# Patient Record
Sex: Male | Born: 1945 | Race: White | Hispanic: Yes | Marital: Married | Smoking: Former smoker
Health system: Southern US, Community
[De-identification: ages and names within clinical notes are randomized; demographics above are authoritative.]

## PROBLEM LIST (undated history)

## (undated) DIAGNOSIS — E785 Hyperlipidemia, unspecified: Secondary | ICD-10-CM

## (undated) HISTORY — DX: Hyperlipidemia, unspecified: E78.5

---
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
## 1974-07-16 HISTORY — PX: EYE SURGERY: SHX253

## 2006-12-01 ENCOUNTER — Emergency Department: Payer: Self-pay | Admitting: Emergency Medicine

## 2009-01-18 ENCOUNTER — Ambulatory Visit: Payer: Self-pay | Admitting: Family Medicine

## 2009-03-22 ENCOUNTER — Ambulatory Visit: Payer: Self-pay | Admitting: Family Medicine

## 2010-06-02 IMAGING — CR DG LUMBAR SPINE 2-3V
1 series · 3 of 3 positions shown · non-contrast
Comparison: none

REASON FOR EXAM: back pain
COMMENTS:

[Series 2: view not recorded · 0.17mm/px · 3 of 3 slices shown]
[im 1/3]
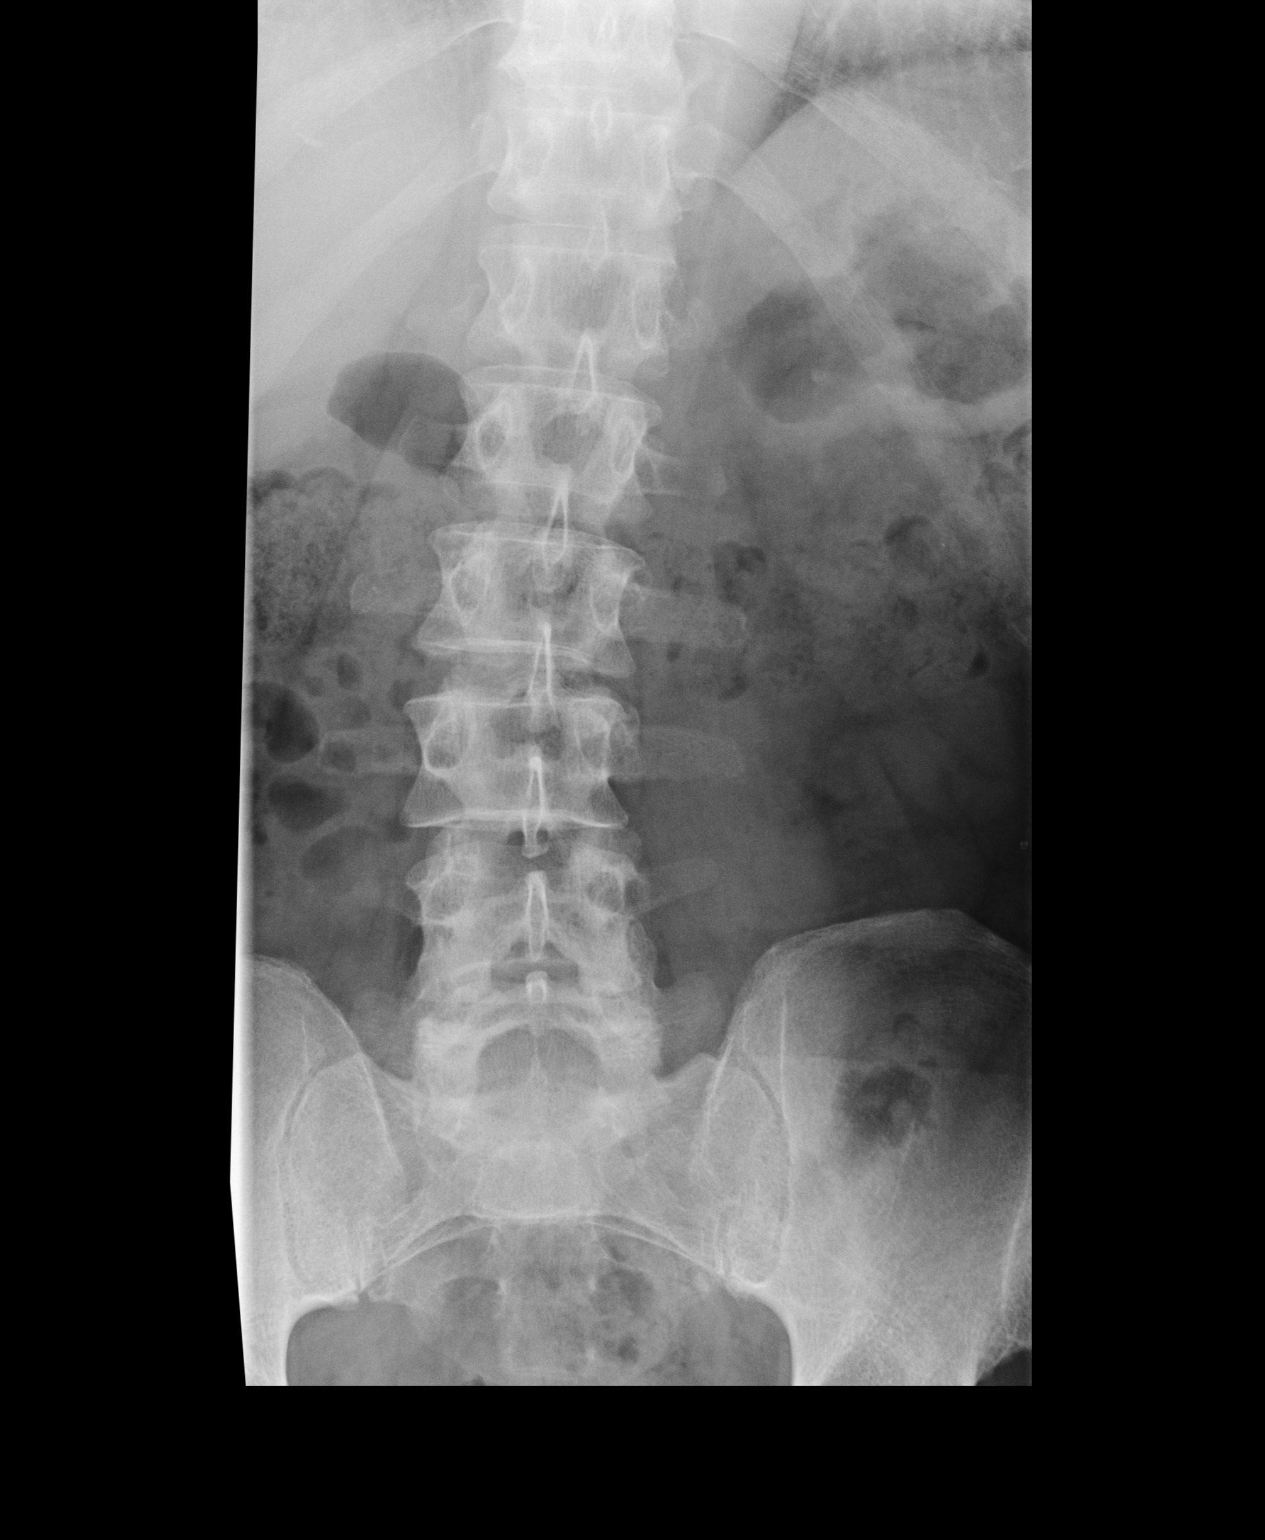
[im 2/3]
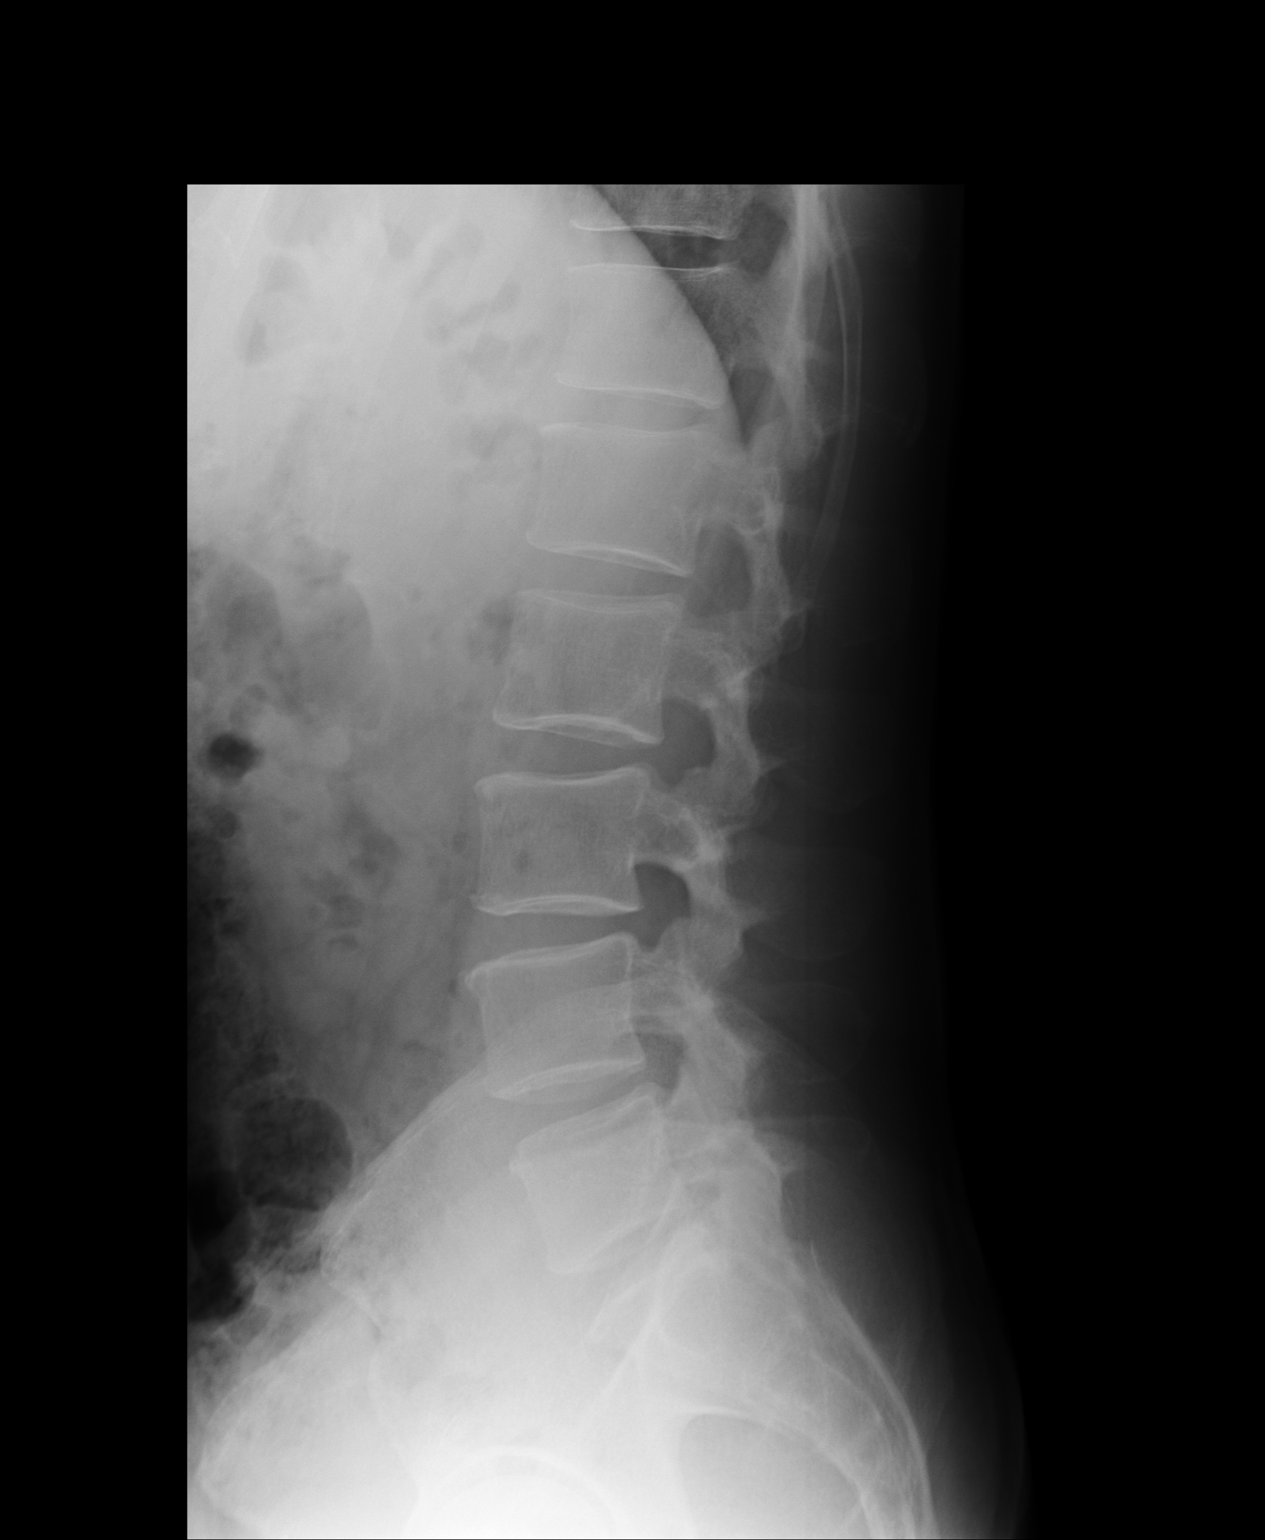
[im 3/3]
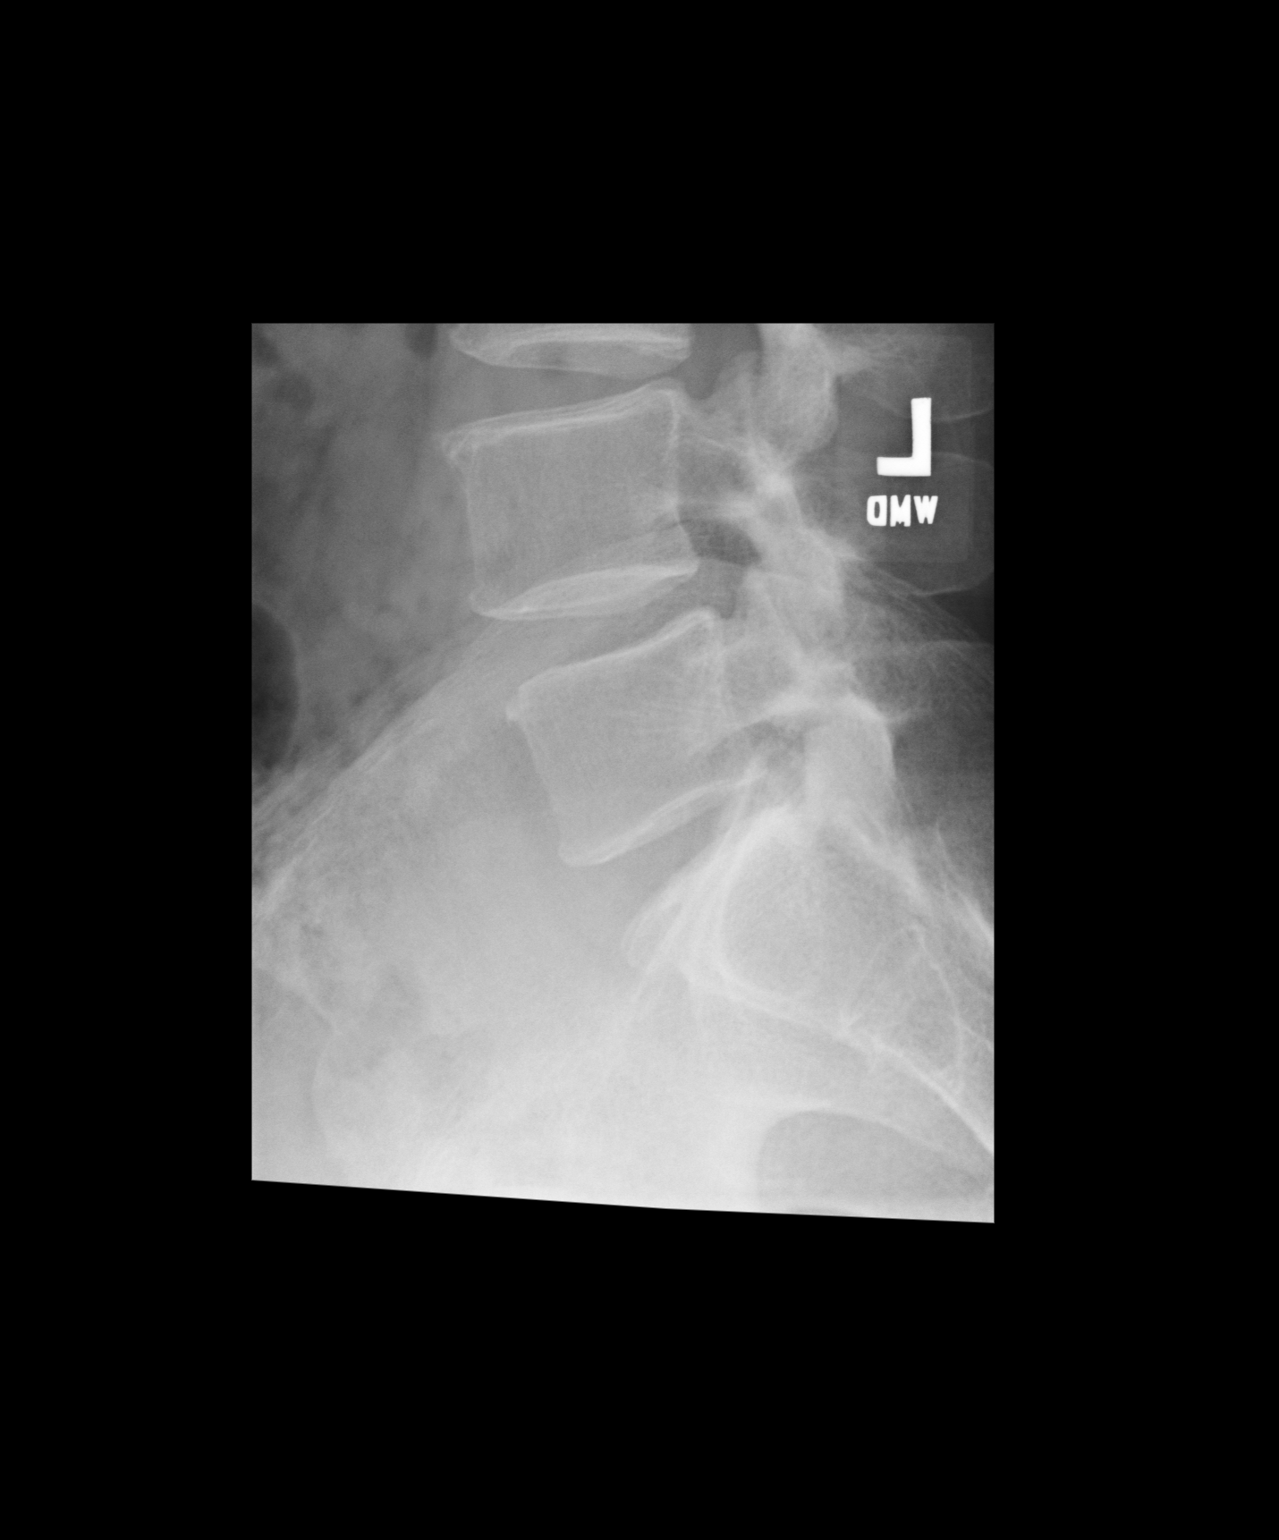

[3 of 3 positions shown; findings below may reference images not displayed]

PROCEDURE:     KDR - KDXR LUMBAR SPINE AP AND LATERAL  - January 18, 2009 [DATE]

RESULT:     The vertebral body heights and the intervertebral disc spaces
are well maintained. The vertebral body alignment is normal. The pedicles
and transverse processes are normal in appearance. In the AP view there is
noted a slight lumbar scoliosis with the convexity to the right. Incidental
note is made that six lumbar vertebral bodies are visualized.
IMPRESSION: 1. No fracture is seen.
2. There is a slight lumbar scoliosis with the convexity to the right.
3. Six lumbar vertebral bodies are visualized.

## 2011-10-01 DIAGNOSIS — R972 Elevated prostate specific antigen [PSA]: Secondary | ICD-10-CM | POA: Diagnosis not present

## 2011-10-01 DIAGNOSIS — E559 Vitamin D deficiency, unspecified: Secondary | ICD-10-CM | POA: Diagnosis not present

## 2011-10-01 DIAGNOSIS — R030 Elevated blood-pressure reading, without diagnosis of hypertension: Secondary | ICD-10-CM | POA: Diagnosis not present

## 2011-10-01 DIAGNOSIS — Z0000 Encounter for general adult medical examination without abnormal findings: Secondary | ICD-10-CM | POA: Diagnosis not present

## 2011-10-05 DIAGNOSIS — Z23 Encounter for immunization: Secondary | ICD-10-CM | POA: Diagnosis not present

## 2011-10-05 DIAGNOSIS — Z0000 Encounter for general adult medical examination without abnormal findings: Secondary | ICD-10-CM | POA: Diagnosis not present

## 2011-11-16 DIAGNOSIS — L570 Actinic keratosis: Secondary | ICD-10-CM | POA: Diagnosis not present

## 2011-11-16 DIAGNOSIS — L821 Other seborrheic keratosis: Secondary | ICD-10-CM | POA: Diagnosis not present

## 2012-01-11 DIAGNOSIS — S8010XA Contusion of unspecified lower leg, initial encounter: Secondary | ICD-10-CM | POA: Diagnosis not present

## 2012-01-11 DIAGNOSIS — Z23 Encounter for immunization: Secondary | ICD-10-CM | POA: Diagnosis not present

## 2012-02-18 DIAGNOSIS — H179 Unspecified corneal scar and opacity: Secondary | ICD-10-CM | POA: Diagnosis not present

## 2012-02-18 DIAGNOSIS — S058X9A Other injuries of unspecified eye and orbit, initial encounter: Secondary | ICD-10-CM | POA: Diagnosis not present

## 2012-02-28 DIAGNOSIS — H1030 Unspecified acute conjunctivitis, unspecified eye: Secondary | ICD-10-CM | POA: Diagnosis not present

## 2012-10-17 DIAGNOSIS — Z1212 Encounter for screening for malignant neoplasm of rectum: Secondary | ICD-10-CM | POA: Diagnosis not present

## 2012-10-17 DIAGNOSIS — Z23 Encounter for immunization: Secondary | ICD-10-CM | POA: Diagnosis not present

## 2012-10-17 DIAGNOSIS — Z1331 Encounter for screening for depression: Secondary | ICD-10-CM | POA: Diagnosis not present

## 2012-10-17 DIAGNOSIS — Z0000 Encounter for general adult medical examination without abnormal findings: Secondary | ICD-10-CM | POA: Diagnosis not present

## 2012-10-17 DIAGNOSIS — Z1339 Encounter for screening examination for other mental health and behavioral disorders: Secondary | ICD-10-CM | POA: Diagnosis not present

## 2012-10-20 DIAGNOSIS — R030 Elevated blood-pressure reading, without diagnosis of hypertension: Secondary | ICD-10-CM | POA: Diagnosis not present

## 2012-10-20 DIAGNOSIS — E559 Vitamin D deficiency, unspecified: Secondary | ICD-10-CM | POA: Diagnosis not present

## 2012-10-20 DIAGNOSIS — Z125 Encounter for screening for malignant neoplasm of prostate: Secondary | ICD-10-CM | POA: Diagnosis not present

## 2012-10-24 DIAGNOSIS — T148 Other injury of unspecified body region: Secondary | ICD-10-CM | POA: Diagnosis not present

## 2012-11-25 DIAGNOSIS — N342 Other urethritis: Secondary | ICD-10-CM | POA: Diagnosis not present

## 2012-11-25 DIAGNOSIS — R300 Dysuria: Secondary | ICD-10-CM | POA: Diagnosis not present

## 2012-11-25 DIAGNOSIS — Z23 Encounter for immunization: Secondary | ICD-10-CM | POA: Diagnosis not present

## 2012-11-25 DIAGNOSIS — Z1212 Encounter for screening for malignant neoplasm of rectum: Secondary | ICD-10-CM | POA: Diagnosis not present

## 2013-02-16 DIAGNOSIS — G4700 Insomnia, unspecified: Secondary | ICD-10-CM | POA: Diagnosis not present

## 2013-02-16 DIAGNOSIS — R1110 Vomiting, unspecified: Secondary | ICD-10-CM | POA: Diagnosis not present

## 2013-02-16 DIAGNOSIS — R972 Elevated prostate specific antigen [PSA]: Secondary | ICD-10-CM | POA: Diagnosis not present

## 2013-02-16 LAB — CBC AND DIFFERENTIAL
HCT: 47 % (ref 41–53)
Hemoglobin: 16.2 g/dL (ref 13.5–17.5)
Platelets: 317 10*3/uL (ref 150–399)
WBC: 7.9 10*3/mL

## 2013-02-16 LAB — BASIC METABOLIC PANEL
BUN: 13 mg/dL (ref 4–21)
Creatinine: 0.9 mg/dL (ref <=1.3)
Potassium: 4.9 mmol/L (ref 3.4–5.3)
Sodium: 139 mmol/L (ref 137–147)

## 2013-02-16 LAB — HEPATIC FUNCTION PANEL
ALK PHOS: 75 U/L (ref 25–125)
ALT: 38 U/L (ref 10–40)
AST: 25 U/L (ref 14–40)
BILIRUBIN, TOTAL: 0.7 mg/dL

## 2013-02-16 LAB — TSH: TSH: 3.09 u[IU]/mL (ref <=5.90)

## 2013-03-20 DIAGNOSIS — R972 Elevated prostate specific antigen [PSA]: Secondary | ICD-10-CM | POA: Diagnosis not present

## 2013-04-16 DIAGNOSIS — R110 Nausea: Secondary | ICD-10-CM | POA: Diagnosis not present

## 2013-04-20 DIAGNOSIS — R0902 Hypoxemia: Secondary | ICD-10-CM | POA: Diagnosis not present

## 2013-07-22 DIAGNOSIS — R198 Other specified symptoms and signs involving the digestive system and abdomen: Secondary | ICD-10-CM | POA: Diagnosis not present

## 2013-07-22 DIAGNOSIS — R972 Elevated prostate specific antigen [PSA]: Secondary | ICD-10-CM | POA: Diagnosis not present

## 2013-07-22 DIAGNOSIS — Z86010 Personal history of colonic polyps: Secondary | ICD-10-CM | POA: Diagnosis not present

## 2013-08-06 DIAGNOSIS — D126 Benign neoplasm of colon, unspecified: Secondary | ICD-10-CM | POA: Diagnosis not present

## 2013-08-06 DIAGNOSIS — K921 Melena: Secondary | ICD-10-CM | POA: Diagnosis not present

## 2013-08-06 DIAGNOSIS — K589 Irritable bowel syndrome without diarrhea: Secondary | ICD-10-CM | POA: Diagnosis not present

## 2013-09-11 DIAGNOSIS — R972 Elevated prostate specific antigen [PSA]: Secondary | ICD-10-CM | POA: Diagnosis not present

## 2013-09-11 DIAGNOSIS — Z86010 Personal history of colonic polyps: Secondary | ICD-10-CM | POA: Diagnosis not present

## 2013-09-11 DIAGNOSIS — R002 Palpitations: Secondary | ICD-10-CM | POA: Diagnosis not present

## 2013-10-14 DIAGNOSIS — K5730 Diverticulosis of large intestine without perforation or abscess without bleeding: Secondary | ICD-10-CM | POA: Diagnosis not present

## 2013-10-14 DIAGNOSIS — K6389 Other specified diseases of intestine: Secondary | ICD-10-CM | POA: Diagnosis not present

## 2013-10-14 DIAGNOSIS — K648 Other hemorrhoids: Secondary | ICD-10-CM | POA: Diagnosis not present

## 2013-10-14 DIAGNOSIS — Z86010 Personal history of colonic polyps: Secondary | ICD-10-CM | POA: Diagnosis not present

## 2013-10-14 DIAGNOSIS — Z1211 Encounter for screening for malignant neoplasm of colon: Secondary | ICD-10-CM | POA: Diagnosis not present

## 2013-10-14 DIAGNOSIS — K5732 Diverticulitis of large intestine without perforation or abscess without bleeding: Secondary | ICD-10-CM | POA: Diagnosis not present

## 2013-10-14 LAB — HM COLONOSCOPY

## 2013-12-11 DIAGNOSIS — D235 Other benign neoplasm of skin of trunk: Secondary | ICD-10-CM | POA: Diagnosis not present

## 2013-12-11 DIAGNOSIS — L821 Other seborrheic keratosis: Secondary | ICD-10-CM | POA: Diagnosis not present

## 2014-03-09 DIAGNOSIS — Z0000 Encounter for general adult medical examination without abnormal findings: Secondary | ICD-10-CM | POA: Diagnosis not present

## 2014-03-09 DIAGNOSIS — Z1331 Encounter for screening for depression: Secondary | ICD-10-CM | POA: Diagnosis not present

## 2014-03-09 DIAGNOSIS — Z1339 Encounter for screening examination for other mental health and behavioral disorders: Secondary | ICD-10-CM | POA: Diagnosis not present

## 2014-03-09 DIAGNOSIS — Z86010 Personal history of colonic polyps: Secondary | ICD-10-CM | POA: Diagnosis not present

## 2014-03-09 DIAGNOSIS — R972 Elevated prostate specific antigen [PSA]: Secondary | ICD-10-CM | POA: Diagnosis not present

## 2014-03-10 DIAGNOSIS — E559 Vitamin D deficiency, unspecified: Secondary | ICD-10-CM | POA: Diagnosis not present

## 2014-03-10 DIAGNOSIS — Z125 Encounter for screening for malignant neoplasm of prostate: Secondary | ICD-10-CM | POA: Diagnosis not present

## 2014-03-10 DIAGNOSIS — E785 Hyperlipidemia, unspecified: Secondary | ICD-10-CM | POA: Diagnosis not present

## 2014-03-10 DIAGNOSIS — Z131 Encounter for screening for diabetes mellitus: Secondary | ICD-10-CM | POA: Diagnosis not present

## 2014-03-10 LAB — BASIC METABOLIC PANEL: Glucose: 107 mg/dL

## 2014-03-10 LAB — LIPID PANEL
CHOLESTEROL: 190 mg/dL (ref 0–200)
HDL: 42 mg/dL (ref 35–70)
LDL Cholesterol: 132 mg/dL
Triglycerides: 80 mg/dL (ref 40–160)

## 2014-03-10 LAB — PSA: PSA: 6

## 2014-03-26 DIAGNOSIS — R972 Elevated prostate specific antigen [PSA]: Secondary | ICD-10-CM | POA: Diagnosis not present

## 2014-04-02 ENCOUNTER — Ambulatory Visit: Payer: Self-pay | Admitting: Family Medicine

## 2014-04-02 DIAGNOSIS — G478 Other sleep disorders: Secondary | ICD-10-CM | POA: Diagnosis not present

## 2014-04-02 DIAGNOSIS — G4700 Insomnia, unspecified: Secondary | ICD-10-CM | POA: Diagnosis not present

## 2014-04-02 DIAGNOSIS — R0609 Other forms of dyspnea: Secondary | ICD-10-CM | POA: Diagnosis not present

## 2014-04-02 DIAGNOSIS — G4733 Obstructive sleep apnea (adult) (pediatric): Secondary | ICD-10-CM | POA: Diagnosis not present

## 2014-04-02 DIAGNOSIS — R0989 Other specified symptoms and signs involving the circulatory and respiratory systems: Secondary | ICD-10-CM | POA: Diagnosis not present

## 2014-04-27 ENCOUNTER — Ambulatory Visit: Payer: Self-pay | Admitting: Family Medicine

## 2014-04-27 DIAGNOSIS — R0683 Snoring: Secondary | ICD-10-CM | POA: Diagnosis not present

## 2014-04-27 DIAGNOSIS — G4733 Obstructive sleep apnea (adult) (pediatric): Secondary | ICD-10-CM | POA: Diagnosis not present

## 2014-07-21 DIAGNOSIS — G4733 Obstructive sleep apnea (adult) (pediatric): Secondary | ICD-10-CM | POA: Diagnosis not present

## 2014-12-16 ENCOUNTER — Encounter: Payer: Self-pay | Admitting: Physician Assistant

## 2014-12-16 ENCOUNTER — Ambulatory Visit (INDEPENDENT_AMBULATORY_CARE_PROVIDER_SITE_OTHER): Payer: Medicare Other | Admitting: Physician Assistant

## 2014-12-16 VITALS — BP 130/78 | HR 74 | Temp 98.7°F | Resp 16 | Wt 195.6 lb

## 2014-12-16 DIAGNOSIS — Z86010 Personal history of colonic polyps: Secondary | ICD-10-CM | POA: Insufficient documentation

## 2014-12-16 DIAGNOSIS — K5790 Diverticulosis of intestine, part unspecified, without perforation or abscess without bleeding: Secondary | ICD-10-CM | POA: Insufficient documentation

## 2014-12-16 DIAGNOSIS — E785 Hyperlipidemia, unspecified: Secondary | ICD-10-CM | POA: Insufficient documentation

## 2014-12-16 DIAGNOSIS — B029 Zoster without complications: Secondary | ICD-10-CM | POA: Insufficient documentation

## 2014-12-16 DIAGNOSIS — D1803 Hemangioma of intra-abdominal structures: Secondary | ICD-10-CM | POA: Insufficient documentation

## 2014-12-16 DIAGNOSIS — M62830 Muscle spasm of back: Secondary | ICD-10-CM

## 2014-12-16 DIAGNOSIS — K589 Irritable bowel syndrome without diarrhea: Secondary | ICD-10-CM | POA: Insufficient documentation

## 2014-12-16 DIAGNOSIS — I10 Essential (primary) hypertension: Secondary | ICD-10-CM | POA: Insufficient documentation

## 2014-12-16 DIAGNOSIS — E559 Vitamin D deficiency, unspecified: Secondary | ICD-10-CM | POA: Insufficient documentation

## 2014-12-16 DIAGNOSIS — R972 Elevated prostate specific antigen [PSA]: Secondary | ICD-10-CM | POA: Insufficient documentation

## 2014-12-16 DIAGNOSIS — N400 Enlarged prostate without lower urinary tract symptoms: Secondary | ICD-10-CM | POA: Insufficient documentation

## 2014-12-16 DIAGNOSIS — W1809XA Striking against other object with subsequent fall, initial encounter: Secondary | ICD-10-CM

## 2014-12-16 DIAGNOSIS — B9681 Helicobacter pylori [H. pylori] as the cause of diseases classified elsewhere: Secondary | ICD-10-CM | POA: Insufficient documentation

## 2014-12-16 DIAGNOSIS — F419 Anxiety disorder, unspecified: Secondary | ICD-10-CM | POA: Insufficient documentation

## 2014-12-16 DIAGNOSIS — R030 Elevated blood-pressure reading, without diagnosis of hypertension: Secondary | ICD-10-CM

## 2014-12-16 DIAGNOSIS — K289 Gastrojejunal ulcer, unspecified as acute or chronic, without hemorrhage or perforation: Secondary | ICD-10-CM

## 2014-12-16 DIAGNOSIS — K219 Gastro-esophageal reflux disease without esophagitis: Secondary | ICD-10-CM | POA: Insufficient documentation

## 2014-12-16 DIAGNOSIS — G43909 Migraine, unspecified, not intractable, without status migrainosus: Secondary | ICD-10-CM | POA: Insufficient documentation

## 2014-12-16 DIAGNOSIS — Z9989 Dependence on other enabling machines and devices: Secondary | ICD-10-CM | POA: Insufficient documentation

## 2014-12-16 DIAGNOSIS — R002 Palpitations: Secondary | ICD-10-CM | POA: Insufficient documentation

## 2014-12-16 DIAGNOSIS — G4733 Obstructive sleep apnea (adult) (pediatric): Secondary | ICD-10-CM | POA: Insufficient documentation

## 2014-12-16 DIAGNOSIS — R198 Other specified symptoms and signs involving the digestive system and abdomen: Secondary | ICD-10-CM | POA: Insufficient documentation

## 2014-12-16 NOTE — Progress Notes (Signed)
   Subjective:    Patient ID: Lawrence Ewing, male    DOB: 08/04/1945, 69 y.o.   MRN: [id number]  Fall The accident occurred 12 to 24 hours ago. The fall occurred while walking. He fell from a height of 1 to 2 ft. He landed on concrete. There was no blood loss. The point of impact was the buttocks and left hip. The pain is present in the left hip and buttocks. The pain is at a severity of 6/10. The pain is moderate. The symptoms are aggravated by movement. Pertinent negatives include no abdominal pain, bowel incontinence, fever, headaches, loss of consciousness, nausea, numbness, tingling or vomiting. He has tried ice and rest for the symptoms. The treatment provided moderate relief.      Review of Systems  Constitutional: Negative for fever, chills and fatigue.  Gastrointestinal: Negative for nausea, vomiting, abdominal pain and bowel incontinence.  Musculoskeletal: Positive for myalgias and back pain. Negative for joint swelling, arthralgias, gait problem, neck pain and neck stiffness.  Neurological: Negative for dizziness, tingling, loss of consciousness, syncope, weakness, numbness and headaches.       Objective:   Physical Exam  Constitutional: He is oriented to person, place, and time. He appears well-developed and well-nourished. No distress.  Musculoskeletal: Normal range of motion. He exhibits no edema.       Lumbar back: He exhibits tenderness, swelling (mild swelling over quadratus lumborum on left and left glute) and spasm (noted in paraspinal muscles and quadratus lumborum on left). He exhibits normal range of motion, no bony tenderness, no edema, no deformity, no laceration, no pain and normal pulse.       Arms: Neurological: He is alert and oriented to person, place, and time. No cranial nerve deficit.  Skin: Skin is warm and dry.  Vitals reviewed.         Assessment & Plan:  1. Fall against object, initial encounter Fell on wet steps in flip flops and hit back  along steps.  No suspected fractures.  No bony tenderness.  Spasm noted on left side.  Patient refuses muscle relaxer or anti-inflammatory.  Advised to continue ice and heat.  May apply icy-hot patch for relief.  Advised to stretch with patch to increase ROM and decrease spasm.  He is to call the office if no improvement in one week.  If no improvement will obtain xray.  2. Muscle spasm of back  Spasm noted on left side.  Patient refuses muscle relaxer or anti-inflammatory.  Advised to continue ice and heat.  May apply icy-hot patch for relief.  Advised to stretch with patch to increase ROM and decrease spasm.  He is to call the office if no improvement in one week.  If no improvement will obtain xray.

## 2014-12-16 NOTE — Patient Instructions (Signed)

## 2015-03-14 DIAGNOSIS — H11003 Unspecified pterygium of eye, bilateral: Secondary | ICD-10-CM | POA: Diagnosis not present

## 2015-03-16 ENCOUNTER — Encounter: Payer: Self-pay | Admitting: Family Medicine

## 2015-03-16 ENCOUNTER — Other Ambulatory Visit: Payer: Self-pay

## 2015-03-16 DIAGNOSIS — Z131 Encounter for screening for diabetes mellitus: Secondary | ICD-10-CM | POA: Diagnosis not present

## 2015-03-16 DIAGNOSIS — Z125 Encounter for screening for malignant neoplasm of prostate: Secondary | ICD-10-CM | POA: Diagnosis not present

## 2015-03-16 DIAGNOSIS — E559 Vitamin D deficiency, unspecified: Secondary | ICD-10-CM | POA: Diagnosis not present

## 2015-03-16 DIAGNOSIS — E785 Hyperlipidemia, unspecified: Secondary | ICD-10-CM | POA: Diagnosis not present

## 2015-03-17 ENCOUNTER — Encounter: Payer: Self-pay | Admitting: Family Medicine

## 2015-03-17 ENCOUNTER — Telehealth: Payer: Self-pay | Admitting: Family Medicine

## 2015-03-17 LAB — LIPID PANEL
Chol/HDL Ratio: 4.6 ratio units (ref 0.0–5.0)
Cholesterol, Total: 180 mg/dL (ref 100–199)
HDL: 39 mg/dL — ABNORMAL LOW (ref >39)
LDL CALC: 117 mg/dL — AB (ref 0–99)
TRIGLYCERIDES: 120 mg/dL (ref 0–149)
VLDL Cholesterol Cal: 24 mg/dL (ref 5–40)

## 2015-03-17 LAB — VITAMIN D 25 HYDROXY (VIT D DEFICIENCY, FRACTURES): Vit D, 25-Hydroxy: 22.9 ng/mL — ABNORMAL LOW (ref 30.0–100.0)

## 2015-03-17 LAB — GLUCOSE, RANDOM: Glucose: 107 mg/dL — ABNORMAL HIGH (ref 65–99)

## 2015-03-17 LAB — PSA: Prostate Specific Ag, Serum: 8 ng/mL — ABNORMAL HIGH (ref 0.0–4.0)

## 2015-03-17 NOTE — Telephone Encounter (Signed)
Pt is returning call.  OF#[phone]/[id number]

## 2015-03-18 NOTE — Telephone Encounter (Signed)
Patient was notified of results. Patient stated that he will call to schedule appt with his urologist.

## 2015-03-25 ENCOUNTER — Encounter: Payer: Self-pay | Admitting: Family Medicine

## 2015-03-25 ENCOUNTER — Ambulatory Visit (INDEPENDENT_AMBULATORY_CARE_PROVIDER_SITE_OTHER): Payer: Medicare Other | Admitting: Family Medicine

## 2015-03-25 VITALS — BP 128/80 | HR 78 | Temp 98.2°F | Resp 16 | Ht 71.0 in | Wt 191.0 lb

## 2015-03-25 DIAGNOSIS — Z23 Encounter for immunization: Secondary | ICD-10-CM | POA: Diagnosis not present

## 2015-03-25 DIAGNOSIS — Z0000 Encounter for general adult medical examination without abnormal findings: Secondary | ICD-10-CM

## 2015-03-25 DIAGNOSIS — G4733 Obstructive sleep apnea (adult) (pediatric): Secondary | ICD-10-CM

## 2015-03-25 DIAGNOSIS — R300 Dysuria: Secondary | ICD-10-CM

## 2015-03-25 DIAGNOSIS — E785 Hyperlipidemia, unspecified: Secondary | ICD-10-CM | POA: Diagnosis not present

## 2015-03-25 DIAGNOSIS — E559 Vitamin D deficiency, unspecified: Secondary | ICD-10-CM | POA: Diagnosis not present

## 2015-03-25 LAB — POCT URINALYSIS DIPSTICK
Bilirubin, UA: NEGATIVE
Glucose, UA: NEGATIVE
KETONES UA: NEGATIVE
LEUKOCYTES UA: NEGATIVE
NITRITE UA: NEGATIVE
PH UA: 7
PROTEIN UA: NEGATIVE
Spec Grav, UA: 1.01
UROBILINOGEN UA: 0.2

## 2015-03-25 NOTE — Progress Notes (Signed)
Patient: Lawrence Ewing, Male    DOB: March 05, 1946, 69 y.o.   MRN: [id number] Visit Date: 03/25/2015  Today's Provider: Lelon Huh, MD   Chief Complaint  Patient presents with  . Medicare Wellness  . Hyperlipidemia    follow up  . Sleep Apnea    follow up   Subjective:    Annual wellness visit Lawrence Ewing is a 69 y.o. male. He feels well. He reports exercising 3- 5 times a week walking. He reports he is sleeping well.  -----------------------------------------------------------   Lipid/Cholesterol, Follow-up:   Last seen for this1 years ago.  Management changes since that visit include  none. . Last Lipid Panel:    Component Value Date/Time   CHOL 180 03/16/2015 1000   CHOL 190 03/10/2014   TRIG 120 03/16/2015 1000   HDL 39* 03/16/2015 1000   HDL 42 03/10/2014   CHOLHDL 4.6 03/16/2015 1000   LDLCALC 117* 03/16/2015 1000   LDLCALC 132 03/10/2014    Risk factors for vascular disease include hypercholesterolemia  He reports good compliance with treatment. (Diet Controlled) He is not having side effects.  Current symptoms include none and have been stable. Weight trend: increasing steadily Prior visit with dietician: no Current diet: in general, a "healthy" diet   Current exercise: walking  Wt Readings from Last 3 Encounters:  03/25/15 191 lb (86.637 kg)  12/16/14 195 lb 9.6 oz (88.724 kg)  07/21/14 197 lb (89.359 kg)    ------------------------------------------------------------------- Follow up Sleep Apnea: Last office visit was 8 months ago and no changes were made. Patient is still using the CPAP machine and reports good tolerance. He is using it every night and feels better. Is more energetic and sleeping solidly.   Follow up Vitamin D Deficiency: Patient recently has labs done on 03/16/2015 which showed a low vitamin D Level. Patient states he was not taking the Vitamin D regularly but plans to take it daily from now on.  Lab Results    Component Value Date   VD25OH 22.9* 03/16/2015    Follow up Elevated PSA: Patient recent labs showed PSA of 8.0. Patient was advised to follow up with Urology soon. Patient states he has an appointment scheduled to see [facility] on 04/22/2015.   Follow up Anxiety: He reports that citalopram is really helping quite a bit and is tolerating well    Review of Systems  Constitutional: Negative for fever, chills, appetite change and fatigue.  HENT: Negative for congestion, ear pain, hearing loss, nosebleeds and trouble swallowing.   Eyes: Negative for pain and visual disturbance.  Respiratory: Negative for cough, chest tightness and shortness of breath.   Cardiovascular: Negative for chest pain, palpitations and leg swelling.  Gastrointestinal: Negative for nausea, vomiting, abdominal pain, diarrhea, constipation and blood in stool.  Endocrine: Negative for polydipsia, polyphagia and polyuria.  Genitourinary: Negative for dysuria and flank pain.  Musculoskeletal: Negative for myalgias, back pain, joint swelling, arthralgias and neck stiffness.  Skin: Negative for color change, rash and wound.  Neurological: Negative for dizziness, tremors, seizures, speech difficulty, weakness, light-headedness and headaches.  Psychiatric/Behavioral: Negative for behavioral problems, confusion, sleep disturbance, dysphoric mood and decreased concentration. The patient is not nervous/anxious.   All other systems reviewed and are negative.   Social History   Social History  . Marital Status: Married    Spouse Name: N/A  . Number of Children: 3  . Years of Education: N/A   Occupational History  . Retired  Previously Freight forwarder in [employer], educational    Social History Main Topics  . Smoking status: Former Research scientist (life sciences)  . Smokeless tobacco: Not on file  . Alcohol Use: 0.0 oz/week    0 Standard drinks or equivalent per week     Comment: occasionally  . Drug Use: No  . Sexual Activity: Not on file   Other  Topics Concern  . Not on file   Social History Narrative    Patient Active Problem List   Diagnosis Date Noted  . Anxiety 12/16/2014  . Altered bowel function 12/16/2014  . 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 Blood pressure elevated 12/16/2014  . Abnormal prostate specific antigen 12/16/2014  . Acid reflux 12/16/2014  . Gastrointestinal ulcer due to Helicobacter pylori 35/32/9924  . H/O adenomatous polyp of colon 12/16/2014  . HLD (hyperlipidemia) 12/16/2014  . 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 Hemangioma of liver 12/16/2014  . Headache, migraine 12/16/2014  . Obstructive sleep apnea of adult 12/16/2014  . Awareness of heartbeats 12/16/2014  . Herpes zona 12/16/2014  . Vitamin D deficiency 12/16/2014    Past Surgical History  Procedure Laterality Date  . Tonsillectomy  1957  . Eye surgery  1976    His family history includes Allergies in his brother; Arthritis in his mother; Hypertension in his mother; Prostate cancer in his father; Stroke in his father.    Previous Medications   CHOLECALCIFEROL (VITAMIN D) 2000 UNITS CAPS    Take 1 capsule by mouth daily.   CITALOPRAM (CELEXA) 10 MG TABLET    Take 10 mg by mouth daily.   CITALOPRAM (CELEXA) 20 MG TABLET    Take 1 tablet by mouth daily.   MULTIPLE VITAMINS-MINERALS PO    Take 1 tablet by mouth daily.   OMEPRAZOLE (PRILOSEC) 20 MG CAPSULE    Take 1 capsule by mouth daily.    Patient Care Team: Birdie Sons, MD as PCP - General (Family Medicine)     Objective:   Vitals: BP 128/80 mmHg  Pulse 78  Temp(Src) 98.2 F (36.8 C) (Oral)  Resp 16  Ht 5\' 11"  (1.803 m)  Wt 191 lb (86.637 kg)  BMI 26.65 kg/m2  SpO2 97%  Physical Exam   General Appearance:    Alert, cooperative, no distress  Eyes:    PERRL, conjunctiva/corneas clear, EOM's intact       Lungs:     Clear to auscultation bilaterally, respirations unlabored  Heart:    Regular rate and rhythm  Neurologic:    Awake, alert, oriented x 3. No apparent focal neurological           defect.       Activities of Daily Living In your present state of health, do you have any difficulty performing the following activities: 03/25/2015  Hearing? N  Vision? N  Difficulty concentrating or making decisions? N  Walking or climbing stairs? N  Dressing or bathing? N  Doing errands, shopping? N    Fall Risk Assessment Fall Risk  03/25/2015  Falls in the past year? Yes  Number falls in past yr: 1  Injury with Fall? Yes     Depression Screen PHQ 2/9 Scores 03/25/2015  PHQ - 2 Score 0    Cognitive Testing - 6-CIT  Correct? Score   What year is it? yes 0 0 or 4  What month is it? yes 0 0 or 3  Memorize:    Pia Mau,  42,  [address],  [place],      What time is it? (within 1 hour) yes 0 0 or 3  Count backwards from 20 yes 0 0, 2, or 4  Name the months of the year yes 0 0, 2, or 4  Repeat name & address above yes 0 0, 2, 4, 6, 8, or 10       TOTAL SCORE  0/28   Interpretation:  Normal  Normal (0-7) Abnormal (8-28)    Audit-C Alcohol Use Screening  Question Answer Points  How often do you have alcoholic drink? 1 times monthly 1  How many drinks do you typically consume in a day? 0 0  How oftey will you drink 6 or more in a total? never 0  Total Score:  1   A score of 3 or more in women, and 4 or more in men indicates increased risk for alcohol abuse, EXCEPT if all of the points are from question 1.    Assessment & Plan:     Annual Wellness Visit  Reviewed patient's Family Medical History Reviewed and updated list of patient's medical providers Assessment of cognitive impairment was done Assessed patient's functional ability Established a written schedule for health screening [place] Completed and Reviewed  Exercise Activities and Dietary recommendations Goals    None      Immunization History  Administered Date(s) Administered  . Pneumococcal  Polysaccharide-23 10/05/2011  . Tdap 02/24/2007    Health Maintenance  Topic Date Due  . COLONOSCOPY  10/15/2023  . ZOSTAVAX  12/10/2005  . PNA vac Low Risk Adult (2 of 2 - PCV13) 10/04/2012  . INFLUENZA VACCINE  02/14/2015  . TETANUS/TDAP  02/23/2017      Discussed health benefits of physical activity, and encouraged him to engage in regular exercise appropriate for his age and condition.    ------------------------------------------------------------------------------------------------------------ 1. Medicare annual wellness visit, subsequent   2. Need for pneumococcal vaccination  - Pneumococcal conjugate vaccine 13-valent IM  3. HLD (hyperlipidemia) Stable, diet controlled  4. Obstructive sleep apnea of adult CPAP remains effective and well tolerating. Is using consistently every night  5. Vitamin D deficiency Back on daily vitamin D supplements.   6. Dysuria  - POCT urinalysis dipstick

## 2015-04-22 DIAGNOSIS — R351 Nocturia: Secondary | ICD-10-CM | POA: Diagnosis not present

## 2015-04-22 DIAGNOSIS — Z8042 Family history of malignant neoplasm of prostate: Secondary | ICD-10-CM | POA: Diagnosis not present

## 2015-04-22 DIAGNOSIS — N401 Enlarged prostate with lower urinary tract symptoms: Secondary | ICD-10-CM | POA: Diagnosis not present

## 2015-04-22 DIAGNOSIS — R972 Elevated prostate specific antigen [PSA]: Secondary | ICD-10-CM | POA: Diagnosis not present

## 2015-04-22 DIAGNOSIS — R350 Frequency of micturition: Secondary | ICD-10-CM | POA: Diagnosis not present

## 2015-04-22 DIAGNOSIS — R300 Dysuria: Secondary | ICD-10-CM | POA: Diagnosis not present

## 2015-05-02 ENCOUNTER — Telehealth: Payer: Self-pay | Admitting: Family Medicine

## 2015-05-02 NOTE — Telephone Encounter (Signed)
Please check with patient to see if he has had follow up with his urologist. His PSA was 8.0 when we checked it in September and he was supposed to get in touch with his urologist. We can set up referral if he has not made this appointment yet. Thanks.

## 2015-05-02 NOTE — Telephone Encounter (Signed)
Patient stated that he went to his urologist Dr. Lonia Skinner,  last week. His PSA was 7.0.

## 2015-05-26 ENCOUNTER — Ambulatory Visit (INDEPENDENT_AMBULATORY_CARE_PROVIDER_SITE_OTHER): Payer: Medicare Other

## 2015-05-26 DIAGNOSIS — Z23 Encounter for immunization: Secondary | ICD-10-CM

## 2015-07-29 DIAGNOSIS — R972 Elevated prostate specific antigen [PSA]: Secondary | ICD-10-CM | POA: Diagnosis not present

## 2015-07-29 DIAGNOSIS — R350 Frequency of micturition: Secondary | ICD-10-CM | POA: Diagnosis not present

## 2015-07-29 DIAGNOSIS — C61 Malignant neoplasm of prostate: Secondary | ICD-10-CM | POA: Diagnosis not present

## 2015-07-29 DIAGNOSIS — Z8042 Family history of malignant neoplasm of prostate: Secondary | ICD-10-CM | POA: Diagnosis not present

## 2015-07-29 DIAGNOSIS — M545 Low back pain: Secondary | ICD-10-CM | POA: Diagnosis not present

## 2015-10-11 ENCOUNTER — Ambulatory Visit: Payer: Medicare Other | Admitting: Family Medicine

## 2015-11-03 DIAGNOSIS — R238 Other skin changes: Secondary | ICD-10-CM | POA: Diagnosis not present

## 2015-11-03 DIAGNOSIS — L538 Other specified erythematous conditions: Secondary | ICD-10-CM | POA: Diagnosis not present

## 2015-11-03 DIAGNOSIS — L820 Inflamed seborrheic keratosis: Secondary | ICD-10-CM | POA: Diagnosis not present

## 2015-11-03 DIAGNOSIS — L821 Other seborrheic keratosis: Secondary | ICD-10-CM | POA: Diagnosis not present

## 2016-03-23 DIAGNOSIS — M545 Low back pain: Secondary | ICD-10-CM | POA: Diagnosis not present

## 2016-03-23 DIAGNOSIS — R399 Unspecified symptoms and signs involving the genitourinary system: Secondary | ICD-10-CM | POA: Diagnosis not present

## 2016-03-23 DIAGNOSIS — R972 Elevated prostate specific antigen [PSA]: Secondary | ICD-10-CM | POA: Diagnosis not present

## 2016-03-27 ENCOUNTER — Encounter: Payer: Self-pay | Admitting: Family Medicine

## 2016-03-27 ENCOUNTER — Ambulatory Visit (INDEPENDENT_AMBULATORY_CARE_PROVIDER_SITE_OTHER): Payer: Medicare Other | Admitting: Family Medicine

## 2016-03-27 VITALS — BP 130/88 | HR 73 | Temp 97.8°F | Resp 16 | Ht 70.5 in | Wt 197.0 lb

## 2016-03-27 DIAGNOSIS — G4733 Obstructive sleep apnea (adult) (pediatric): Secondary | ICD-10-CM

## 2016-03-27 DIAGNOSIS — R5383 Other fatigue: Secondary | ICD-10-CM

## 2016-03-27 DIAGNOSIS — Z23 Encounter for immunization: Secondary | ICD-10-CM | POA: Diagnosis not present

## 2016-03-27 DIAGNOSIS — Z0000 Encounter for general adult medical examination without abnormal findings: Secondary | ICD-10-CM | POA: Diagnosis not present

## 2016-03-27 NOTE — Progress Notes (Signed)
Patient: Lawrence Ewing, Male    DOB: Jun 15, 1946, 70 y.o.   MRN: [id number] Visit Date: 03/27/2016  Today's Provider: Lelon Huh, MD   Chief Complaint  Patient presents with  . Medicare Wellness  . Sleep Apnea  . Hyperlipidemia   Subjective:    Annual wellness visit Lawrence Ewing is a 70 y.o. male. He feels well. He reports exercising 3 times a week. He reports he is sleeping poorly.    Fatigue:  She states he has been somewhat more fatigued lately. States his brother was recently diagnosed with thyroid disorder and concerned he may have same problem.   Follow up Obstructive sleep Apnea:  Patient was last seen for this problem 1 year ago and no changes were made. Patient reports good compliance with CPAP machine, good tolerance and fair symptom control.    Lipid/Cholesterol, Follow-up:   Last seen for this1 years ago.  Management changes since that visit include none; diet controlled. . Last Lipid Panel:    Component Value Date/Time   CHOL 180 03/16/2015 1000   TRIG 120 03/16/2015 1000   HDL 39 (L) 03/16/2015 1000   CHOLHDL 4.6 03/16/2015 1000   LDLCALC 117 (H) 03/16/2015 1000    Risk factors for vascular disease include hypercholesterolemia  He reports good compliance with treatment. He is not having side effects.  Current symptoms include none and have been stable. Weight trend: increasing steadily Prior visit with dietician: no Current diet: in general, a "healthy" diet   Current exercise: walking  Wt Readings from Last 3 Encounters:  03/25/15 191 lb (86.6 kg)  12/16/14 195 lb 9.6 oz (88.7 kg)  07/21/14 197 lb (89.4 kg)    -------------------------------------------------------------------  Follow anxiety  Is taking 20mg  and 10mg  citalopram daily which he feels is working well. Being followed by psychiatry Dr. Carlis Abbott  Review of Systems  Constitutional: Negative for appetite change, chills, fatigue and fever.  HENT: Positive for  tinnitus. Negative for congestion, ear pain, hearing loss, nosebleeds and trouble swallowing.   Eyes: Negative for pain and visual disturbance.  Respiratory: Negative for cough, chest tightness and shortness of breath.   Cardiovascular: Negative for chest pain, palpitations and leg swelling.  Gastrointestinal: Negative for abdominal pain, blood in stool, constipation, diarrhea, nausea and vomiting.  Endocrine: Negative for polydipsia, polyphagia and polyuria.  Genitourinary: Negative for dysuria and flank pain.  Musculoskeletal: Negative for arthralgias, back pain, joint swelling, myalgias and neck stiffness.  Skin: Negative for color change, rash and wound.  Neurological: Negative for dizziness, tremors, seizures, speech difficulty, weakness, light-headedness and headaches.  Psychiatric/Behavioral: Negative for behavioral problems, confusion, decreased concentration, dysphoric mood and sleep disturbance. The patient is not nervous/anxious.   All other systems reviewed and are negative.   Social History   Social History  . Marital status: Married    Spouse name: N/A  . Number of children: 3  . Years of education: N/A   Occupational History  . Retired     Previously Freight forwarder in [employer], educational    Social History Main Topics  . Smoking status: Former Research scientist (life sciences)  . Smokeless tobacco: Never Used  . Alcohol use 0.0 oz/week     Comment: occasionally  . Drug use: No  . Sexual activity: Not on file   Other Topics Concern  . Not on file   Social History Narrative  . No narrative on file    No past medical history on file.   Patient Active Problem List  Diagnosis Date Noted  . Dysuria 03/25/2015  . Anxiety 12/16/2014  . Altered bowel function 12/16/2014  . 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 Blood pressure elevated 12/16/2014  . Abnormal prostate specific antigen 12/16/2014  . Acid reflux 12/16/2014  . Gastrointestinal ulcer due to  Helicobacter pylori 123456  . H/O adenomatous polyp of colon 12/16/2014  . HLD (hyperlipidemia) 12/16/2014  . 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 Hemangioma of liver 12/16/2014  . Headache, migraine 12/16/2014  . Obstructive sleep apnea of adult 12/16/2014  . Awareness of heartbeats 12/16/2014  . Herpes zona 12/16/2014  . Vitamin D deficiency 12/16/2014    Past Surgical History:  Procedure Laterality Date  . EYE SURGERY  1976  . TONSILLECTOMY  1957    His family history includes Allergies in his brother; Arthritis in his mother; Hypertension in his mother; Prostate cancer in his father; Stroke in his father.    Current Meds  Medication Sig  . Cholecalciferol (VITAMIN D) 2000 UNITS CAPS Take 1 capsule by mouth daily.  . citalopram (CELEXA) 10 MG tablet Take 10 mg by mouth daily.  . citalopram (CELEXA) 20 MG tablet Take 1 tablet by mouth daily.  . MULTIPLE VITAMINS-MINERALS PO Take 1 tablet by mouth daily.  Marland Kitchen omeprazole (PRILOSEC) 20 MG capsule Take 1 capsule by mouth daily.    Patient Care Team: Birdie Sons, MD as PCP - General (Family Medicine)    Objective:   Vitals: BP 130/88 (BP Location: Right Arm, Patient Position: Sitting, Cuff Size: Large)   Pulse 73   Temp 97.8 F (36.6 C) (Oral)   Resp 16   Ht 5' 10.5" (1.791 m)   Wt 197 lb (89.4 kg)   SpO2 96% Comment: room air  BMI 27.87 kg/m   Physical Exam   General Appearance:    Alert, cooperative, no distress  Eyes:    PERRL, conjunctiva/corneas clear, EOM's intact       Lungs:     Clear to auscultation bilaterally, respirations unlabored  Heart:    Regular rate and rhythm  Neurologic:   Awake, alert, oriented x 3. No apparent focal neurological           defect.        Activities of Daily Living In your present state of health, do you have any difficulty performing the following activities: 03/27/2016  Hearing? N  Vision? N  Difficulty concentrating or making decisions? N  Walking or climbing stairs? N    Dressing or bathing? N  Doing errands, shopping? N  Some recent data might be hidden    Fall Risk Assessment Fall Risk  03/27/2016 03/25/2015  Falls in the past year? No Yes  Number falls in past yr: - 1  Injury with Fall? - Yes     Depression Screen PHQ 2/9 Scores 03/27/2016 03/25/2015  PHQ - 2 Score 0 0    Cognitive Testing - 6-CIT  Correct? Score   What year is it? yes 0 0 or 4  What month is it? yes 0 0 or 3  Memorize:    Pia [address]au,  42,  [place],      What time is it? (within 1 hour) yes 0 0 or 3  Count backwards from 20 yes 0 0, 2, or 4  Name the months of the year no 2 0, 2, or 4  Repeat name & address above no 4 0, 2, 4, 6, 8,  or 10       TOTAL SCORE  6/28   Interpretation:  Normal  Normal (0-7) Abnormal (8-28)    Audit-C Alcohol Use Screening  Question Answer Points  How often do you have alcoholic drink? 1 times monthly 1  On days you do drink alcohol, how many drinks do you typically consume? n/a 0  How oftey will you drink 6 or more in a total? never 0  Total Score:  1   A score of 3 or more in women, and 4 or more in men indicates increased risk for alcohol abuse, EXCEPT if all of the points are from question 1.   Current Exercise Habits: Home exercise routine, Type of exercise: walking, Time (Minutes): 60, Frequency (Times/Week): 3, Weekly Exercise (Minutes/Week): 180, Intensity: Moderate Exercise limited by: None identified  Assessment & Plan:     Annual Wellness Visit  Reviewed patient's Family Medical History Reviewed and updated list of patient's medical providers Assessment of cognitive impairment was done Assessed patient's functional ability Established a written schedule for health screening [place] Completed and Reviewed  Exercise Activities and Dietary recommendations Goals    None      Immunization History  Administered Date(s) Administered  . Influenza, High Dose Seasonal PF 05/26/2015  .  Pneumococcal Conjugate-13 03/25/2015  . Pneumococcal Polysaccharide-23 10/05/2011  . Tdap 02/24/2007    Health Maintenance  Topic Date Due  . Hepatitis C Screening  1945-09-04  . ZOSTAVAX  12/10/2005  . INFLUENZA VACCINE  02/14/2016  . TETANUS/TDAP  02/23/2017  . COLONOSCOPY  10/15/2023  . PNA vac Low Risk Adult  Completed      Discussed health benefits of physical activity, and encouraged him to engage in regular exercise appropriate for his age and condition.    ------------------------------------------------------------------------------------------------------------  1. Medicare annual wellness visit, subsequent   2. Other fatigue  - Comprehensive metabolic panel - CBC - TSH  3. Obstructive sleep apnea of adult Doing well and consistently using CPAP  4. Need for influenza vaccination  - Flu vaccine HIGH DOSE PF   Lelon Huh, MD  [facility]

## 2016-03-28 ENCOUNTER — Telehealth: Payer: Self-pay

## 2016-03-28 LAB — COMPREHENSIVE METABOLIC PANEL
A/G RATIO: 1.5 (ref 1.2–2.2)
ALK PHOS: 70 IU/L (ref 39–117)
ALT: 36 IU/L (ref 0–44)
AST: 20 IU/L (ref 0–40)
Albumin: 4 g/dL (ref 3.5–4.8)
BUN/Creatinine Ratio: 19 (ref 10–24)
BUN: 16 mg/dL (ref 8–27)
Bilirubin Total: 0.5 mg/dL (ref 0.0–1.2)
CALCIUM: 9.3 mg/dL (ref 8.6–10.2)
CO2: 24 mmol/L (ref 18–29)
CREATININE: 0.86 mg/dL (ref 0.76–1.27)
Chloride: 102 mmol/L (ref 96–106)
GFR calc Af Amer: 102 mL/min/{1.73_m2} (ref >59)
GFR calc non Af Amer: 88 mL/min/{1.73_m2} (ref >59)
GLOBULIN, TOTAL: 2.7 g/dL (ref 1.5–4.5)
Glucose: 100 mg/dL — ABNORMAL HIGH (ref 65–99)
POTASSIUM: 4.8 mmol/L (ref 3.5–5.2)
SODIUM: 141 mmol/L (ref 134–144)
Total Protein: 6.7 g/dL (ref 6.0–8.5)

## 2016-03-28 LAB — CBC
HEMOGLOBIN: 15.4 g/dL (ref 12.6–17.7)
Hematocrit: 45.7 % (ref 37.5–51.0)
MCH: 29.6 pg (ref 26.6–33.0)
MCHC: 33.7 g/dL (ref 31.5–35.7)
MCV: 88 fL (ref 79–97)
PLATELETS: 302 10*3/uL (ref 150–379)
RBC: 5.2 x10E6/uL (ref 4.14–5.80)
RDW: 13.1 % (ref 12.3–15.4)
WBC: 6.9 10*3/uL (ref 3.4–10.8)

## 2016-03-28 LAB — TSH: TSH: 4.47 u[IU]/mL (ref 0.450–4.500)

## 2016-03-28 NOTE — Telephone Encounter (Signed)
Pt advised. Emily Drozdowski, CMA  

## 2016-03-28 NOTE — Telephone Encounter (Signed)
-----   Message from Birdie Sons, MD sent at 03/28/2016  7:50 AM EDT ----- Blood sugar, kidney functions, blood cell count, electrolytes and thyroid are all normal. Check labs yearly.

## 2016-03-28 NOTE — Telephone Encounter (Signed)
LMTCB

## 2016-05-30 ENCOUNTER — Encounter: Payer: Self-pay | Admitting: Family Medicine

## 2016-05-30 ENCOUNTER — Ambulatory Visit (INDEPENDENT_AMBULATORY_CARE_PROVIDER_SITE_OTHER): Payer: Medicare Other | Admitting: Family Medicine

## 2016-05-30 VITALS — BP 132/68 | HR 80 | Temp 98.1°F | Resp 20 | Wt 200.0 lb

## 2016-05-30 DIAGNOSIS — R0982 Postnasal drip: Secondary | ICD-10-CM | POA: Diagnosis not present

## 2016-05-30 DIAGNOSIS — R05 Cough: Secondary | ICD-10-CM

## 2016-05-30 DIAGNOSIS — R059 Cough, unspecified: Secondary | ICD-10-CM

## 2016-05-30 MED ORDER — MONTELUKAST SODIUM 10 MG PO TABS: 10.0000 mg | ORAL_TABLET | Freq: Every day | ORAL | 0 refills | Status: DC

## 2016-05-30 MED ORDER — MONTELUKAST SODIUM 10 MG PO TABS: 10.0000 mg | ORAL_TABLET | Freq: Every day | ORAL | 3 refills | Status: DC

## 2016-05-30 NOTE — Progress Notes (Signed)
       Patient: Lawrence Ewing Male    DOB: 1945-09-30   70 y.o.   MRN: [id number] Visit Date: 05/30/2016  Today's Provider: Lelon Huh, MD   Chief Complaint  Patient presents with  . URI    X 2 weeks.    Subjective:    HPI Patient comes in today c/o constant cough. He reports that he has had symptoms for about 2 weeks. He reports that initially his symptoms seemed to be getting better, but 2 days ago he began to start coughing again. Patient denies any fever, chills, or shortness of breath. Patient has not been taking anything OTC for his symptoms. He uses natural gas to heat his home.     No Known Allergies   Current Outpatient Prescriptions:  .  Cholecalciferol (VITAMIN D) 2000 UNITS CAPS, Take 1 capsule by mouth daily., Disp: , Rfl:  .  citalopram (CELEXA) 10 MG tablet, Take 10 mg by mouth daily., Disp: , Rfl: 3 .  citalopram (CELEXA) 20 MG tablet, Take 1 tablet by mouth daily., Disp: , Rfl:  .  MULTIPLE VITAMINS-MINERALS PO, Take 1 tablet by mouth daily., Disp: , Rfl:  .  omeprazole (PRILOSEC) 20 MG capsule, Take 1 capsule by mouth daily., Disp: , Rfl:  .  tamsulosin (FLOMAX) 0.4 MG CAPS capsule, Take 1 capsule by mouth every other day., Disp: , Rfl:   Review of Systems  Constitutional: Negative.   HENT: Positive for congestion. Negative for postnasal drip, rhinorrhea, sinus pain, sinus pressure and sore throat.   Respiratory: Positive for cough.   Cardiovascular: Negative.   Neurological: Negative for dizziness, facial asymmetry, light-headedness and headaches.    Social History  Substance Use Topics  . Smoking status: Former Research scientist (life sciences)  . Smokeless tobacco: Never Used  . Alcohol use 0.0 oz/week     Comment: occasionally   Objective:   BP 132/68 (BP Location: Right Arm, Patient Position: Sitting, Cuff Size: Normal)   Pulse 80   Temp 98.1 F (36.7 C)   Resp 20   Wt 200 lb (90.7 kg)   BMI 28.29 kg/m   Physical Exam  General Appearance:    Alert,  cooperative, no distress  HENT:   bilateral TM normal without fluid or infection, neck without nodes, sinuses nontender, post nasal drip noted and nasal mucosa pale and congested  Eyes:    PERRL, conjunctiva/corneas clear, EOM's intact       Lungs:     Clear to auscultation bilaterally, respirations unlabored  Heart:    Regular rate and rhythm  Neurologic:   Awake, alert, oriented x 3. No apparent focal neurological           defect.           Assessment & Plan:     1. Cough   2. Post-nasal drainage  - montelukast (SINGULAIR) 10 MG tablet; Take 1 tablet (10 mg total) by mouth at bedtime.  Dispense: 30 tablet; Refill: 0  Patient Instructions   Recommend OTC Mucinex DM (guaifensin)   You should use a humidifier especially in your bedroom at night        Lelon Huh, MD  [facility]

## 2016-05-30 NOTE — Patient Instructions (Addendum)
   Recommend OTC Mucinex DM (guaifensin)   You should use a humidifier especially in your bedroom at night

## 2016-09-20 DIAGNOSIS — H90A21 Sensorineural hearing loss, unilateral, right ear, with restricted hearing on the contralateral side: Secondary | ICD-10-CM | POA: Diagnosis not present

## 2016-09-20 DIAGNOSIS — H6123 Impacted cerumen, bilateral: Secondary | ICD-10-CM | POA: Diagnosis not present

## 2016-09-20 DIAGNOSIS — H903 Sensorineural hearing loss, bilateral: Secondary | ICD-10-CM | POA: Diagnosis not present

## 2016-10-19 DIAGNOSIS — R972 Elevated prostate specific antigen [PSA]: Secondary | ICD-10-CM | POA: Diagnosis not present

## 2016-10-19 DIAGNOSIS — Z79899 Other long term (current) drug therapy: Secondary | ICD-10-CM | POA: Diagnosis not present

## 2016-10-19 DIAGNOSIS — Z8042 Family history of malignant neoplasm of prostate: Secondary | ICD-10-CM | POA: Diagnosis not present

## 2016-12-17 ENCOUNTER — Ambulatory Visit (INDEPENDENT_AMBULATORY_CARE_PROVIDER_SITE_OTHER): Payer: Medicare Other | Admitting: Family Medicine

## 2016-12-17 VITALS — BP 156/78 | HR 66 | Temp 97.8°F | Resp 14 | Wt 197.0 lb

## 2016-12-17 DIAGNOSIS — R197 Diarrhea, unspecified: Secondary | ICD-10-CM | POA: Diagnosis not present

## 2016-12-17 MED ORDER — METRONIDAZOLE 500 MG PO TABS: 500.0000 mg | ORAL_TABLET | Freq: Three times a day (TID) | ORAL | 0 refills | Status: DC

## 2016-12-17 NOTE — Patient Instructions (Addendum)
Start taking OTC Probiotics (such as Align) until symptoms have resolved.    Diarrhea, Adult Diarrhea is frequent loose and watery bowel movements. Diarrhea can make you feel weak and cause you to become dehydrated. Dehydration can make you tired and thirsty, cause you to have a dry mouth, and decrease how often you urinate. Diarrhea typically lasts 2-3 days. However, it can last longer if it is a sign of something more serious. It is important to treat your diarrhea as told by your health care provider. Follow these instructions at home: Eating and drinking  Follow these recommendations as told by your health care provider:  Take an oral rehydration solution (ORS). This is a drink that is sold at pharmacies and retail stores.  Drink clear fluids, such as water, ice chips, diluted fruit juice, and low-calorie sports drinks.  Eat bland, easy-to-digest foods in small amounts as you are able. These foods include bananas, applesauce, rice, lean meats, toast, and crackers.  Avoid drinking fluids that contain a lot of sugar or caffeine, such as energy drinks, sports drinks, and soda.  Avoid alcohol.  Avoid spicy or fatty foods.  General instructions  Drink enough fluid to keep your urine clear or pale yellow.  Wash your hands often. If soap and water are not available, use hand sanitizer.  Make sure that all people in your household wash their hands well and often.  Take over-the-counter and prescription medicines only as told by your health care provider.  Rest at home while you recover.  Watch your condition for any changes.  Take a warm bath to relieve any burning or pain from frequent diarrhea episodes.  Keep all follow-up visits as told by your health care provider. This is important. Contact a health care provider if:  You have a fever.  Your diarrhea gets worse.  You have new symptoms.  You cannot keep fluids down.  You feel light-headed or dizzy.  You have a  headache  You have muscle cramps. Get help right away if:  You have chest pain.  You feel extremely weak or you faint.  You have bloody or black stools or stools that look like tar.  You have severe pain, cramping, or bloating in your abdomen.  You have trouble breathing or you are breathing very quickly.  Your heart is beating very quickly.  Your skin feels cold and clammy.  You feel confused.  You have signs of dehydration, such as: ? Dark urine, very little urine, or no urine. ? Cracked lips. ? Dry mouth. ? Sunken eyes. ? Sleepiness. ? Weakness. This information is not intended to replace advice given to you by your health care provider. Make sure you discuss any questions you have with your health care provider. Document Released: 06/22/2002 Document Revised: 11/10/2015 Document Reviewed: 03/08/2015 Elsevier Interactive Patient Education  2017 [employer].

## 2016-12-17 NOTE — Progress Notes (Signed)
Patient: Lawrence Ewing Male    DOB: July 30, 1945   71 y.o.   MRN: [id number] Visit Date: 12/17/2016  Today's Provider: Lelon Huh, MD   Chief Complaint  Patient presents with  . Diarrhea   Subjective:    HPI  Patient states that he has had diarrhea since May 31st last week, has been going to the bathroom 3 to 4 times a day until about Saturday 12/15/16 and is now still having loose stools about 2 times a day. He is able to drink and eat. He has been drinking Gatorade and water. He has been nauseas in the morning usually, abdominal bloated and burning sensation. No vomiting. NO fever or chills. Patient states he has history of diverticulitis and symptoms remind him of the flare up  of this he had before. No blood in stool. Cramping has mostly resolved.   Allergies  Allergen Reactions  . Sumatriptan Succinate Other (See Comments)    Other Reaction: FELT COLD &PULSE SLOWED  . Temazepam Other (See Comments)    Other Reaction: PROFOUND DROWSINESS  . Zolpidem Other (See Comments)    Other Reaction: PROLONGED SEDATION     Current Outpatient Prescriptions:  .  Cholecalciferol (VITAMIN D) 2000 UNITS CAPS, Take 1 capsule by mouth daily., Disp: , Rfl:  .  citalopram (CELEXA) 10 MG tablet, Take 10 mg by mouth daily., Disp: , Rfl: 3 .  citalopram (CELEXA) 20 MG tablet, Take 1 tablet by mouth daily., Disp: , Rfl:  .  MULTIPLE VITAMINS-MINERALS PO, Take 1 tablet by mouth daily., Disp: , Rfl:  .  omeprazole (PRILOSEC) 20 MG capsule, Take 1 capsule by mouth daily., Disp: , Rfl:  .  montelukast (SINGULAIR) 10 MG tablet, Take 1 tablet (10 mg total) by mouth at bedtime., Disp: 30 tablet, Rfl: 0 .  tamsulosin (FLOMAX) 0.4 MG CAPS capsule, Take 1 capsule by mouth every other day., Disp: , Rfl:   Review of Systems  Constitutional: Negative.   Respiratory: Negative.   Cardiovascular: Negative.   Gastrointestinal: Positive for abdominal distention, abdominal pain, diarrhea and nausea.  Negative for blood in stool, rectal pain and vomiting.  Musculoskeletal: Negative.     Social History  Substance Use Topics  . Smoking status: Former Research scientist (life sciences)  . Smokeless tobacco: Never Used  . Alcohol use 0.0 oz/week     Comment: occasionally   Objective:   BP (!) 156/78   Pulse 66   Temp 97.8 F (36.6 C)   Resp 14   Wt 197 lb (89.4 kg)   BMI 27.87 kg/m  Vitals:   12/17/16 1453  BP: (!) 156/78  Pulse: 66  Resp: 14  Temp: 97.8 F (36.6 C)  Weight: 197 lb (89.4 kg)     Physical Exam  General Appearance:    Alert, cooperative, no distress  Eyes:    PERRL, conjunctiva/corneas clear, EOM's intact       Lungs:     Clear to auscultation bilaterally, respirations unlabored  Heart:    Regular rate and rhythm  Abdomen:   bowel sounds present and normal in all 4 quadrants, soft, round, nontender or nondistended. No CVA tenderness        Assessment & Plan:     1. Diarrhea, unspecified type Bland diet. OTC Probiotics.  - metroNIDAZOLE (FLAGYL) 500 MG tablet; Take 1 tablet (500 mg total) by mouth 3 (three) times daily.  Dispense: 21 tablet; Refill: 0  Call if symptoms change or if not  rapidly improving.          Lelon Huh, MD  [facility]

## 2017-01-10 DIAGNOSIS — K582 Mixed irritable bowel syndrome: Secondary | ICD-10-CM | POA: Diagnosis not present

## 2017-02-06 ENCOUNTER — Encounter: Payer: Self-pay | Admitting: Family Medicine

## 2017-02-06 ENCOUNTER — Ambulatory Visit (INDEPENDENT_AMBULATORY_CARE_PROVIDER_SITE_OTHER): Payer: Medicare Other | Admitting: Family Medicine

## 2017-02-06 VITALS — BP 144/88 | HR 88 | Temp 97.7°F | Resp 16 | Wt 195.0 lb

## 2017-02-06 DIAGNOSIS — R197 Diarrhea, unspecified: Secondary | ICD-10-CM | POA: Diagnosis not present

## 2017-02-06 MED ORDER — METRONIDAZOLE 500 MG PO TABS: 500.0000 mg | ORAL_TABLET | Freq: Three times a day (TID) | ORAL | 0 refills | Status: AC

## 2017-02-06 NOTE — Progress Notes (Signed)
       Patient: Lawrence Ewing Male    DOB: [birth year]   71 y.o.   MRN: [id number] Visit Date: 02/06/2017  Today's Provider: Lelon Huh, MD   Chief Complaint  Patient presents with  . Abdominal Pain    x 1 day   Subjective:    Abdominal Pain  This is a recurrent problem. The current episode started yesterday. The problem occurs intermittently. The problem has been gradually worsening. Pain location: lower abdomen. The quality of the pain is cramping. Associated symptoms include belching, diarrhea and flatus. Pertinent negatives include no anorexia, arthralgias, constipation, dysuria, frequency, headaches, hematuria, melena, myalgias, nausea or vomiting. The pain is relieved by nothing.   Patient states he experienced the same symptoms about 1 months ago and was treated with Metronidazole. He states symptoms resolved quickly after starting metronidazole. He has since had follow up with GI and not felt to require any further work up for the time being.     Allergies  Allergen Reactions  . Sumatriptan Succinate Other (See Comments)    Other Reaction: FELT COLD &PULSE SLOWED  . Temazepam Other (See Comments)    Other Reaction: PROFOUND DROWSINESS  . Zolpidem Other (See Comments)    Other Reaction: PROLONGED SEDATION     Current Outpatient Prescriptions:  .  Cholecalciferol (VITAMIN D) 2000 UNITS CAPS, Take 1 capsule by mouth daily., Disp: , Rfl:  .  MULTIPLE VITAMINS-MINERALS PO, Take 1 tablet by mouth daily., Disp: , Rfl:  .  omeprazole (PRILOSEC) 20 MG capsule, Take 1 capsule by mouth daily., Disp: , Rfl:  .  tamsulosin (FLOMAX) 0.4 MG CAPS capsule, Take 1 capsule by mouth every other day., Disp: , Rfl:   Review of Systems  Gastrointestinal: Positive for abdominal pain, diarrhea and flatus. Negative for anorexia, constipation, melena, nausea and vomiting.  Genitourinary: Negative for dysuria, frequency and hematuria.  Musculoskeletal: Negative for arthralgias and myalgias.    Neurological: Negative for headaches.    Social History  Substance Use Topics  . Smoking status: Former Research scientist (life sciences)  . Smokeless tobacco: Never Used  . Alcohol use 0.0 oz/week     Comment: occasionally   Objective:   BP (!) 144/88 (BP Location: Left Arm, Patient Position: Sitting, Cuff Size: Normal)   Pulse 88   Temp 97.7 F (36.5 C) (Oral)   Resp 16   Wt 195 lb (88.5 kg)   SpO2 97% Comment: room air  BMI 27.58 kg/m  Vitals:   02/06/17 1450  Resp: 16  Weight: 195 lb (88.5 kg)     Physical Exam  General Appearance:    Alert, cooperative, no distress  Eyes:    PERRL, conjunctiva/corneas clear, EOM's intact       Lungs:     Clear to auscultation bilaterally, respirations unlabored  Heart:    Regular rate and rhythm  Abdomen:   bowel sounds present and normal in all 4 quadrants, soft, round, nontender or nondistended. No CVA tenderness      Assessment & Plan:     1. Diarrhea, unspecified type Responded well to metronidazole when last treated in June.  - metroNIDAZOLE (FLAGYL) 500 MG tablet; Take 1 tablet (500 mg total) by mouth 3 (three) times daily.  Dispense: 21 tablet; Refill: 0  Recommend he start daily probiotic supplement.       Lelon Huh, MD  [facility]

## 2017-02-06 NOTE — Patient Instructions (Addendum)
Start taking OTC probiotic supplements such as Align once daily   Diverticulitis Diverticulitis is inflammation or infection of small pouches in your colon that form when you have a condition called diverticulosis. The pouches in your colon are called diverticula. Your colon, or large intestine, is where water is absorbed and stool is formed. Complications of diverticulitis can include:  Bleeding.  Severe infection.  Severe pain.  Perforation of your colon.  Obstruction of your colon.  What are the causes? Diverticulitis is caused by bacteria. Diverticulitis happens when stool becomes trapped in diverticula. This allows bacteria to grow in the diverticula, which can lead to inflammation and infection. What increases the risk? People with diverticulosis are at risk for diverticulitis. Eating a diet that does not include enough fiber from fruits and vegetables may make diverticulitis more likely to develop. What are the signs or symptoms? Symptoms of diverticulitis may include:  Abdominal pain and tenderness. The pain is normally located on the left side of the abdomen, but may occur in other areas.  Fever and chills.  Bloating.  Cramping.  Nausea.  Vomiting.  Constipation.  Diarrhea.  Blood in your stool.  How is this diagnosed? Your health care provider will ask you about your medical history and do a physical exam. You may need to have tests done because many medical conditions can cause the same symptoms as diverticulitis. Tests may include:  Blood tests.  Urine tests.  Imaging tests of the abdomen, including X-rays and CT scans.  When your condition is under control, your health care provider may recommend that you have a colonoscopy. A colonoscopy can show how severe your diverticula are and whether something else is causing your symptoms. How is this treated? Most cases of diverticulitis are mild and can be treated at home. Treatment may include:  Taking  over-the-counter pain medicines.  Following a clear liquid diet.  Taking antibiotic medicines by mouth for 7-10 days.  More severe cases may be treated at a hospital. Treatment may include:  Not eating or drinking.  Taking prescription pain medicine.  Receiving antibiotic medicines through an IV tube.  Receiving fluids and nutrition through an IV tube.  Surgery.  Follow these instructions at home:  Follow your health care provider's instructions carefully.  Follow a full liquid diet or other diet as directed by your health care provider. After your symptoms improve, your health care provider may tell you to change your diet. He or she may recommend you eat a high-fiber diet. Fruits and vegetables are good sources of fiber. Fiber makes it easier to pass stool.  Take fiber supplements or probiotics as directed by your health care provider.  Only take medicines as directed by your health care provider.  Keep all your follow-up appointments. Contact a health care provider if:  Your pain does not improve.  You have a hard time eating food.  Your bowel movements do not return to normal. Get help right away if:  Your pain becomes worse.  Your symptoms do not get better.  Your symptoms suddenly get worse.  You have a fever.  You have repeated vomiting.  You have bloody or black, tarry stools. This information is not intended to replace advice given to you by your health care provider. Make sure you discuss any questions you have with your health care provider. Document Released: 04/11/2005 Document Revised: 12/08/2015 Document Reviewed: 05/27/2013 Elsevier Interactive Patient Education  2017 [employer].

## 2017-02-25 DIAGNOSIS — K5792 Diverticulitis of intestine, part unspecified, without perforation or abscess without bleeding: Secondary | ICD-10-CM | POA: Diagnosis not present

## 2017-04-01 ENCOUNTER — Telehealth: Payer: Self-pay | Admitting: Family Medicine

## 2017-04-26 DIAGNOSIS — L538 Other specified erythematous conditions: Secondary | ICD-10-CM | POA: Diagnosis not present

## 2017-04-26 DIAGNOSIS — R208 Other disturbances of skin sensation: Secondary | ICD-10-CM | POA: Diagnosis not present

## 2017-04-26 DIAGNOSIS — L820 Inflamed seborrheic keratosis: Secondary | ICD-10-CM | POA: Diagnosis not present

## 2017-05-02 DIAGNOSIS — Z23 Encounter for immunization: Secondary | ICD-10-CM | POA: Diagnosis not present

## 2017-05-16 ENCOUNTER — Telehealth: Payer: Self-pay

## 2017-05-16 NOTE — Telephone Encounter (Signed)
Pt is calling checking on status of forms that were faxed to [place] release medical records to [facility] for a life insurance policy. Has this been taken care of?

## 2017-05-27 DIAGNOSIS — H2513 Age-related nuclear cataract, bilateral: Secondary | ICD-10-CM | POA: Diagnosis not present

## 2017-07-19 ENCOUNTER — Encounter: Payer: Self-pay | Admitting: Family Medicine

## 2017-07-19 ENCOUNTER — Ambulatory Visit (INDEPENDENT_AMBULATORY_CARE_PROVIDER_SITE_OTHER): Payer: Medicare Other | Admitting: Family Medicine

## 2017-07-19 VITALS — BP 144/8 | HR 75 | Temp 98.0°F | Resp 16 | Wt 195.0 lb

## 2017-07-19 DIAGNOSIS — R42 Dizziness and giddiness: Secondary | ICD-10-CM

## 2017-07-19 NOTE — Progress Notes (Signed)
Patient: Lawrence Ewing Male    DOB: 12/19/1945   72 y.o.   MRN: [id number] Visit Date: 07/19/2017  Today's Provider: Lelon Huh, MD   Chief Complaint  Patient presents with  . Dizziness   Subjective:    Patient stated that he has been having lightheadedness for around 6 months. Patient states he has noticed symptoms have worsened lately. Patient stated 2 days ago he had episode where he was driving at night and became dizzy. Patient stated that when he got home he was feeling nauseous and dizzy. Patient stated that he vomited and felt better afterward.    Dizziness  This is a recurrent problem. The current episode started more than 1 month ago (6 months). The problem occurs intermittently. The problem has been gradually worsening. Associated symptoms include fatigue, nausea, vertigo and vomiting. Pertinent negatives include no abdominal pain, anorexia, arthralgias, change in bowel habit, chest pain, chills, congestion, coughing, diaphoresis, fever, headaches, joint swelling, myalgias, neck pain, numbness, rash, sore throat, swollen glands, urinary symptoms, visual change or weakness.   States he had similar episode of feeling light headed and dizzy about a month ago but not any vomiting, similar to previous panic attacks he experienced before taking citalopram.  No visual disturbances. No headaches. No dyspnea, has occasional palpitations.    Allergies  Allergen Reactions  . Sumatriptan Succinate Other (See Comments)    Other Reaction: FELT COLD &PULSE SLOWED  . Temazepam Other (See Comments)    Other Reaction: PROFOUND DROWSINESS  . Zolpidem Other (See Comments)    Other Reaction: PROLONGED SEDATION     Current Outpatient Medications:  .  Cholecalciferol (VITAMIN D) 2000 UNITS CAPS, Take 1 capsule by mouth daily., Disp: , Rfl:  .  citalopram (CELEXA) 10 MG tablet, Take 10 mg by mouth daily., Disp: , Rfl:  .  citalopram (CELEXA) 20 MG tablet, Take 20 mg by mouth  daily., Disp: , Rfl:  .  MULTIPLE VITAMINS-MINERALS PO, Take 1 tablet by mouth daily., Disp: , Rfl:  .  omeprazole (PRILOSEC) 20 MG capsule, Take 1 capsule by mouth daily., Disp: , Rfl:  .  Probiotic Product (PROBIOTIC PO), Take by mouth daily., Disp: , Rfl:  .  tamsulosin (FLOMAX) 0.4 MG CAPS capsule, Take 1 capsule by mouth every other day., Disp: , Rfl:   Review of Systems  Constitutional: Positive for fatigue. Negative for appetite change, chills, diaphoresis and fever.  HENT: Negative for congestion and sore throat.   Respiratory: Negative for cough, chest tightness, shortness of breath and wheezing.   Cardiovascular: Negative for chest pain and palpitations.  Gastrointestinal: Positive for nausea and vomiting. Negative for abdominal pain, anorexia and change in bowel habit.  Musculoskeletal: Negative for arthralgias, joint swelling, myalgias and neck pain.  Skin: Negative for rash.  Neurological: Positive for dizziness and vertigo. Negative for weakness, numbness and headaches.    Social History   Tobacco Use  . Smoking status: Former Research scientist (life sciences)  . Smokeless tobacco: Never Used  Substance Use Topics  . Alcohol use: Yes    Alcohol/week: 0.0 oz    Comment: occasionally   Objective:   BP (!) 144/8 (BP Location: Right Arm, Patient Position: Sitting, Cuff Size: Normal)   Pulse 75   Temp 98 F (36.7 C) (Oral)   Resp 16   Wt 195 lb (88.5 kg)   SpO2 98%   BMI 27.58 kg/m  Vitals:   07/19/17 1629  BP: (!) 144/8  Pulse: 75  Resp: 16  Temp: 98 F (36.7 C)  TempSrc: Oral  SpO2: 98%  Weight: 195 lb (88.5 kg)     Physical Exam   General Appearance:    Alert, cooperative, no distress  Eyes:    PERRL, conjunctiva/corneas clear, EOM's intact       Lungs:     Clear to auscultation bilaterally, respirations unlabored  Heart:    Regular rate and rhythm. No murmurs. No carotid bruits.   Neurologic:   Awake, alert, oriented x 3. No apparent focal neurological           defect.        EKG NSR    Assessment & Plan:     1. Lightheadedness  - EKG 12-Lead  2. Dizziness  - CBC - Comprehensive metabolic panel  Sx now resolved. C/w with prior panic attacks, but is on SSRI now.       Lelon Huh, MD  [facility]

## 2017-07-22 DIAGNOSIS — R42 Dizziness and giddiness: Secondary | ICD-10-CM | POA: Diagnosis not present

## 2017-07-23 ENCOUNTER — Telehealth: Payer: Self-pay

## 2017-07-23 LAB — CBC
HEMATOCRIT: 47.7 % (ref 37.5–51.0)
HEMOGLOBIN: 16.1 g/dL (ref 13.0–17.7)
MCH: 29.5 pg (ref 26.6–33.0)
MCHC: 33.8 g/dL (ref 31.5–35.7)
MCV: 88 fL (ref 79–97)
Platelets: 325 10*3/uL (ref 150–379)
RBC: 5.45 x10E6/uL (ref 4.14–5.80)
RDW: 13.4 % (ref 12.3–15.4)
WBC: 7.1 10*3/uL (ref 3.4–10.8)

## 2017-07-23 LAB — COMPREHENSIVE METABOLIC PANEL
ALBUMIN: 4.2 g/dL (ref 3.5–4.8)
ALK PHOS: 64 IU/L (ref 39–117)
ALT: 34 IU/L (ref 0–44)
AST: 23 IU/L (ref 0–40)
Albumin/Globulin Ratio: 1.4 (ref 1.2–2.2)
BUN / CREAT RATIO: 15 (ref 10–24)
BUN: 14 mg/dL (ref 8–27)
Bilirubin Total: 0.7 mg/dL (ref 0.0–1.2)
CO2: 24 mmol/L (ref 20–29)
CREATININE: 0.95 mg/dL (ref 0.76–1.27)
Calcium: 9.4 mg/dL (ref 8.6–10.2)
Chloride: 100 mmol/L (ref 96–106)
GFR calc non Af Amer: 80 mL/min/{1.73_m2} (ref >59)
GFR, EST AFRICAN AMERICAN: 93 mL/min/{1.73_m2} (ref >59)
GLOBULIN, TOTAL: 2.9 g/dL (ref 1.5–4.5)
Glucose: 96 mg/dL (ref 65–99)
Potassium: 4.9 mmol/L (ref 3.5–5.2)
Sodium: 140 mmol/L (ref 134–144)
Total Protein: 7.1 g/dL (ref 6.0–8.5)

## 2017-07-23 NOTE — Telephone Encounter (Signed)
lmtcb

## 2017-07-23 NOTE — Telephone Encounter (Signed)
-----   Message from Birdie Sons, MD sent at 07/23/2017  7:36 AM EST ----- Labs all completely normal. Symptoms were likely related to anxiety or panic attacks.

## 2017-07-23 NOTE — Telephone Encounter (Signed)
Patient advised as below.  

## 2017-09-20 DIAGNOSIS — R972 Elevated prostate specific antigen [PSA]: Secondary | ICD-10-CM | POA: Diagnosis not present

## 2017-09-20 DIAGNOSIS — Z79899 Other long term (current) drug therapy: Secondary | ICD-10-CM | POA: Diagnosis not present

## 2017-09-20 DIAGNOSIS — Z881 Allergy status to other antibiotic agents status: Secondary | ICD-10-CM | POA: Diagnosis not present

## 2017-10-17 DIAGNOSIS — N400 Enlarged prostate without lower urinary tract symptoms: Secondary | ICD-10-CM | POA: Diagnosis not present

## 2017-10-17 DIAGNOSIS — R972 Elevated prostate specific antigen [PSA]: Secondary | ICD-10-CM | POA: Diagnosis not present

## 2017-10-17 NOTE — Telephone Encounter (Signed)
complete

## 2018-02-27 ENCOUNTER — Telehealth: Payer: Self-pay | Admitting: Family Medicine

## 2018-02-27 NOTE — Telephone Encounter (Signed)
I left a message asking the pt to call me at [phone] to schedule AWV w/ [facility]. VDM (DD) °

## 2018-02-27 NOTE — Telephone Encounter (Signed)
Pt called me back and scheduled.

## 2018-03-07 ENCOUNTER — Ambulatory Visit (INDEPENDENT_AMBULATORY_CARE_PROVIDER_SITE_OTHER): Payer: Medicare Other

## 2018-03-07 VITALS — BP 146/82 | HR 74 | Temp 97.7°F | Ht 71.0 in | Wt 199.6 lb

## 2018-03-07 DIAGNOSIS — Z0000 Encounter for general adult medical examination without abnormal findings: Secondary | ICD-10-CM

## 2018-03-07 NOTE — Progress Notes (Signed)
Subjective:   Lawrence Ewing is a 72 y.o. male who presents for Medicare Annual/Subsequent preventive examination.  Review of Systems:  N/A  Cardiac Risk Factors include: advanced age (>81men, >13 women);male gender;dyslipidemia     Objective:    Vitals: BP (!) 146/82 (BP Location: Right Arm)   Pulse 74   Temp 97.7 F (36.5 C) (Oral)   Ht 5\' 11"  (1.803 m)   Wt 199 lb 9.6 oz (90.5 kg)   BMI 27.84 kg/m   Body mass index is 27.84 kg/m.  Advanced Directives 03/07/2018 12/16/2014  Does Patient Have a Medical Advance Directive? Yes Yes  Type of Paramedic of [place];Living will [place];Living will  Copy of [place] in Chart? No - copy requested -    Tobacco Social History   Tobacco Use  Smoking Status Former Smoker  . Types: Cigarettes  Smokeless Tobacco Never Used  Tobacco Comment   quit @ age 22     Counseling given: Not Answered Comment: quit @ age 35   Clinical Intake:  Pre-visit preparation completed: Yes  Pain : No/denies pain Pain Score: 0-No pain     Nutritional Status: BMI 25 -29 Overweight Nutritional Risks: None Diabetes: No  How often do you need to have someone help you when you read instructions, pamphlets, or other written materials from your doctor or pharmacy?: 1 - Never  Interpreter Needed?: No  Information entered by :: [facility], LPN  Past Medical History:  Diagnosis Date  . Hyperlipidemia    Past Surgical History:  Procedure Laterality Date  . EYE SURGERY  1976  . TONSILLECTOMY  1957   Family History  Problem Relation Age of Onset  . Hypertension Mother   . Arthritis Mother   . Stroke Father   . Prostate cancer Father   . Allergies Brother    Social History   Socioeconomic History  . Marital status: Married    Spouse name: Not on file  . Number of children: 3  . Years of education: Not on file  . Highest education level: Bachelor's degree (e.g., BA,  AB, BS)  Occupational History  . 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 Financial resource strain: Not hard at all  . Food insecurity:    Worry: Never true    Inability: Never true  . Transportation needs:    Medical: No    Non-medical: No  Tobacco Use  . Smoking status: Former Smoker    Types: Cigarettes  . Smokeless tobacco: Never Used  . Tobacco comment: quit @ age 15  Substance and Sexual Activity  . Alcohol use: Yes    Alcohol/week: 0.0 standard drinks    Comment: occasionally - monthly  . Drug use: No  . Sexual activity: Not on file  Lifestyle  . Physical activity:    Days per week: Not on file    Minutes per session: Not on file  . Stress: Only a little  Relationships  . Social connections:    Talks on phone: Not on file    Gets together: Not on file    Attends religious service: Not on file    Active member of club or organization: Not on file    Attends meetings of clubs or organizations: Not on file    Relationship status: Not on file  Other Topics Concern  . Not on file  Social History Narrative  .  Not on file    Outpatient Encounter Medications as of 03/07/2018  Medication Sig  . Cholecalciferol (VITAMIN D) 2000 UNITS CAPS Take 1 capsule by mouth daily.  . citalopram (CELEXA) 10 MG tablet Take 10 mg by mouth daily.  . citalopram (CELEXA) 20 MG tablet Take 20 mg by mouth daily.  . MULTIPLE VITAMINS-MINERALS PO Take 1 tablet by mouth daily.  Marland Kitchen omeprazole (PRILOSEC) 20 MG capsule Take 1 capsule by mouth daily.  . Probiotic Product (PROBIOTIC PO) Take by mouth daily.  . tamsulosin (FLOMAX) 0.4 MG CAPS capsule Take 0.4 mg by mouth daily.  . vitamin E 400 UNIT capsule Take 400 Units by mouth daily.   No facility-administered encounter medications on file as of 03/07/2018.     Activities of Daily Living In your present state of health, do you have any difficulty performing the following activities: 03/07/2018    Hearing? N  Vision? N  Difficulty concentrating or making decisions? N  Walking or climbing stairs? N  Dressing or bathing? N  Doing errands, shopping? N  Preparing Food and eating ? N  Using the Toilet? N  In the past six months, have you accidently leaked urine? N  Do you have problems with loss of bowel control? N  Managing your Medications? N  Managing your Finances? N  Housekeeping or managing your Housekeeping? N  Some recent data might be hidden    Patient Care Team: Birdie Sons, MD as PCP - General (Family Medicine) Thomes Lolling, MD as Referring Physician (Urology) Jerilynn Som, MD as Consulting Physician (Psychiatry) Charlies Constable, MD as Referring Physician (Gastroenterology)   Assessment:   This is a routine wellness examination for [facility].  Exercise Activities and Dietary recommendations Current Exercise Habits: Home exercise routine, Type of exercise: walking, Time (Minutes): 40(to 60 mins at a time. ), Frequency (Times/Week): 7, Weekly Exercise (Minutes/Week): 280, Intensity: Mild, Exercise limited by: None identified  Goals    . DIET - REDUCE SUGAR INTAKE     Recommend to cut back on sweets and desserts in daily diet to none.        Fall Risk Fall Risk  03/07/2018 03/27/2016 03/25/2015  Falls in the past year? No No Yes  Number falls in past yr: - - 1  Injury with Fall? - - Yes   Is the patient's home free of loose throw rugs in walkways, pet beds, electrical cords, etc?   yes      Grab bars in the bathroom? no      Handrails on the stairs?   no      Adequate lighting?   yes  Timed Get Up and Go Performed: N/A  Depression Screen PHQ 2/9 Scores 03/07/2018 03/27/2016 03/25/2015  PHQ - 2 Score 0 0 0    Cognitive Function        Immunization History  Administered Date(s) Administered  . Influenza, High Dose Seasonal PF 05/26/2015, 03/27/2016  . Influenza-Unspecified 05/02/2017  . Pneumococcal Conjugate-13 03/25/2015  . Pneumococcal  Polysaccharide-23 10/05/2011  . Tdap 02/24/2007  . Zoster Recombinat (Shingrix) 01/30/2018    Qualifies for Shingles Vaccine? Due for Shingles vaccine. Declined my offer to administer today. Education has been provided regarding the importance of this vaccine. Pt has been advised to call her insurance company to determine her out of pocket expense. Advised she may also receive this vaccine at her local pharmacy or Health Dept. Verbalized acceptance and understanding.  Screening Tests Health Maintenance  Topic Date  Due  . Hepatitis C Screening  1946/07/12  . TETANUS/TDAP  02/23/2017  . INFLUENZA VACCINE  02/13/2018  . COLONOSCOPY  10/15/2023  . PNA vac Low Risk Adult  Completed   Cancer Screenings: Lung: Low Dose CT Chest recommended if Age 32-80 years, 30 pack-year currently smoking OR have quit w/in 15years. Patient does not qualify. Colorectal: Up to date  Additional Screenings:  Hepatitis C Screening: Pt declines today.       Plan:  I have personally reviewed and addressed the Medicare Annual Wellness questionnaire and have noted the following in the patient's chart:  A. Medical and social history B. Use of alcohol, tobacco or illicit drugs  C. Current medications and supplements D. Functional ability and status E.  Nutritional status F.  Physical activity G. Advance directives H. List of other physicians I.  Hospitalizations, surgeries, and ER visits in previous 12 months J.  [place] such as hearing and vision if needed, cognitive and depression L. Referrals and appointments - none  In addition, I have reviewed and discussed with patient certain preventive protocols, quality metrics, and best practice recommendations. A written personalized care plan for preventive services as well as general preventive health recommendations were provided to patient.  See attached scanned questionnaire for additional information.   Signed,  Fabio Neighbors, LPN Nurse  Health Advisor   Nurse Recommendations: Pt declined the tetanus vaccine and Hep C lab today.

## 2018-03-07 NOTE — Patient Instructions (Signed)
[name] , Thank you for taking time to come for your Medicare Wellness Visit. I appreciate your ongoing commitment to your health goals. Please review the following plan we discussed and let me know if I can assist you in the future.   Screening recommendations/referrals: Colonoscopy: Up to date Recommended yearly ophthalmology/optometry visit for glaucoma screening and checkup Recommended yearly dental visit for hygiene and checkup  Vaccinations: Influenza vaccine: Up to date Pneumococcal vaccine: Up to date Tdap vaccine: Pt declines today.  Shingles vaccine: Pt declines today.     Advanced directives: Please bring a copy of your POA (Power of Attorney) and/or Living Will to your next appointment.   Conditions/risks identified: Recommend to cut back on sweets and desserts in daily diet to none.   Next appointment: 03/25/18 @ 2 PM with Dr Caryn Section.   Preventive Care 72 Years and Older, Male Preventive care refers to lifestyle choices and visits with your health care provider that can promote health and wellness. What does preventive care include?  A yearly physical exam. This is also called an annual well check.  Dental exams once or twice a year.  Routine eye exams. Ask your health care provider how often you should have your eyes checked.  Personal lifestyle choices, including:  Daily care of your teeth and gums.  Regular physical activity.  Eating a healthy diet.  Avoiding tobacco and drug use.  Limiting alcohol use.  Practicing safe sex.  Taking low doses of aspirin every day.  Taking vitamin and mineral supplements as recommended by your health care provider. What happens during an annual well check? The services and screenings done by your health care provider during your annual well check will depend on your age, overall health, lifestyle risk factors, and family history of disease. Counseling  Your health care provider may ask you questions about  your:  Alcohol use.  Tobacco use.  Drug use.  Emotional well-being.  Home and relationship well-being.  Sexual activity.  Eating habits.  History of falls.  Memory and ability to understand (cognition).  Work and work Statistician. Screening  You may have the following tests or measurements:  Height, weight, and BMI.  Blood pressure.  Lipid and cholesterol levels. These may be checked every 5 years, or more frequently if you are over 84 years old.  Skin check.  Lung cancer screening. You may have this screening every year starting at age 30 if you have a 30-pack-year history of smoking and currently smoke or have quit within the past 15 years.  Fecal occult blood test (FOBT) of the stool. You may have this test every year starting at age 14.  Flexible sigmoidoscopy or colonoscopy. You may have a sigmoidoscopy every 5 years or a colonoscopy every 10 years starting at age 36.  Prostate cancer screening. Recommendations will vary depending on your family history and other risks.  Hepatitis C blood test.  Hepatitis B blood test.  Sexually transmitted disease (STD) testing.  Diabetes screening. This is done by checking your blood sugar (glucose) after you have not eaten for a while (fasting). You may have this done every 1-3 years.  Abdominal aortic aneurysm (AAA) screening. You may need this if you are a current or former smoker.  Osteoporosis. You may be screened starting at age 33 if you are at high risk. Talk with your health care provider about your test results, treatment options, and if necessary, the need for more tests. Vaccines  Your health care provider may  recommend certain vaccines, such as:  Influenza vaccine. This is recommended every year.  Tetanus, diphtheria, and acellular pertussis (Tdap, Td) vaccine. You may need a Td booster every 10 years.  Zoster vaccine. You may need this after age 10.  Pneumococcal 13-valent conjugate (PCV13) vaccine.  One dose is recommended after age 61.  Pneumococcal polysaccharide (PPSV23) vaccine. One dose is recommended after age 72. Talk to your health care provider about which screenings and vaccines you need and how often you need them. This information is not intended to replace advice given to you by your health care provider. Make sure you discuss any questions you have with your health care provider. Document Released: 07/29/2015 Document Revised: 03/21/2016 Document Reviewed: 05/03/2015 Elsevier Interactive Patient Education  2017 [place] Prevention in the Home Falls can cause injuries. They can happen to people of all ages. There are many things you can do to make your home safe and to help prevent falls. What can I do on the outside of my home?  Regularly fix the edges of walkways and driveways and fix any cracks.  Remove anything that might make you trip as you walk through a door, such as a raised step or threshold.  Trim any bushes or trees on the path to your home.  Use bright outdoor lighting.  Clear any walking paths of anything that might make someone trip, such as rocks or tools.  Regularly check to see if handrails are loose or broken. Make sure that both sides of any steps have handrails.  Any raised decks and porches should have guardrails on the edges.  Have any leaves, snow, or ice cleared regularly.  Use sand or salt on walking paths during winter.  Clean up any spills in your garage right away. This includes oil or grease spills. What can I do in the bathroom?  Use night lights.  Install grab bars by the toilet and in the tub and shower. Do not use towel bars as grab bars.  Use non-skid mats or decals in the tub or shower.  If you need to sit down in the shower, use a plastic, non-slip stool.  Keep the floor dry. Clean up any water that spills on the floor as soon as it happens.  Remove soap buildup in the tub or shower regularly.  Attach bath  mats securely with double-sided non-slip rug tape.  Do not have throw rugs and other things on the floor that can make you trip. What can I do in the bedroom?  Use night lights.  Make sure that you have a light by your bed that is easy to reach.  Do not use any sheets or blankets that are too big for your bed. They should not hang down onto the floor.  Have a firm chair that has side arms. You can use this for support while you get dressed.  Do not have throw rugs and other things on the floor that can make you trip. What can I do in the kitchen?  Clean up any spills right away.  Avoid walking on wet floors.  Keep items that you use a lot in easy-to-reach places.  If you need to reach something above you, use a strong step stool that has a grab bar.  Keep electrical cords out of the way.  Do not use floor polish or wax that makes floors slippery. If you must use wax, use non-skid floor wax.  Do not have throw rugs and  other things on the floor that can make you trip. What can I do with my stairs?  Do not leave any items on the stairs.  Make sure that there are handrails on both sides of the stairs and use them. Fix handrails that are broken or loose. Make sure that handrails are as long as the stairways.  Check any carpeting to make sure that it is firmly attached to the stairs. Fix any carpet that is loose or worn.  Avoid having throw rugs at the top or bottom of the stairs. If you do have throw rugs, attach them to the floor with carpet tape.  Make sure that you have a light switch at the top of the stairs and the bottom of the stairs. If you do not have them, ask someone to add them for you. What else can I do to help prevent falls?  Wear shoes that:  Do not have high heels.  Have rubber bottoms.  Are comfortable and fit you well.  Are closed at the toe. Do not wear sandals.  If you use a stepladder:  Make sure that it is fully opened. Do not climb a closed  stepladder.  Make sure that both sides of the stepladder are locked into place.  Ask someone to hold it for you, if possible.  Clearly mark and make sure that you can see:  Any grab bars or handrails.  First and last steps.  Where the edge of each step is.  Use tools that help you move around (mobility aids) if they are needed. These include:  Canes.  Walkers.  Scooters.  Crutches.  Turn on the lights when you go into a dark area. Replace any light bulbs as soon as they burn out.  Set up your furniture so you have a clear path. Avoid moving your furniture around.  If any of your floors are uneven, fix them.  If there are any pets around you, be aware of where they are.  Review your medicines with your doctor. Some medicines can make you feel dizzy. This can increase your chance of falling. Ask your doctor what other things that you can do to help prevent falls. This information is not intended to replace advice given to you by your health care provider. Make sure you discuss any questions you have with your health care provider. Document Released: 04/28/2009 Document Revised: 12/08/2015 Document Reviewed: 08/06/2014 Elsevier Interactive Patient Education  2017 [employer].

## 2018-03-25 ENCOUNTER — Encounter: Payer: Self-pay | Admitting: Family Medicine

## 2018-03-25 ENCOUNTER — Ambulatory Visit (INDEPENDENT_AMBULATORY_CARE_PROVIDER_SITE_OTHER): Payer: Medicare Other | Admitting: Family Medicine

## 2018-03-25 ENCOUNTER — Telehealth: Payer: Self-pay | Admitting: Family Medicine

## 2018-03-25 VITALS — BP 135/75 | HR 85 | Temp 97.8°F | Resp 16 | Wt 201.0 lb

## 2018-03-25 DIAGNOSIS — E559 Vitamin D deficiency, unspecified: Secondary | ICD-10-CM

## 2018-03-25 DIAGNOSIS — Z23 Encounter for immunization: Secondary | ICD-10-CM | POA: Diagnosis not present

## 2018-03-25 DIAGNOSIS — G4733 Obstructive sleep apnea (adult) (pediatric): Secondary | ICD-10-CM | POA: Diagnosis not present

## 2018-03-25 DIAGNOSIS — F419 Anxiety disorder, unspecified: Secondary | ICD-10-CM | POA: Diagnosis not present

## 2018-03-25 DIAGNOSIS — E785 Hyperlipidemia, unspecified: Secondary | ICD-10-CM

## 2018-03-25 DIAGNOSIS — Z86010 Personal history of colonic polyps: Secondary | ICD-10-CM | POA: Diagnosis not present

## 2018-03-25 DIAGNOSIS — Z1159 Encounter for screening for other viral diseases: Secondary | ICD-10-CM | POA: Diagnosis not present

## 2018-03-25 NOTE — Telephone Encounter (Signed)
Patient needs supplied for CPAP machine. Can you please send copy of today's office visit documenting need for CPAP to [facility]. Thanks.

## 2018-03-25 NOTE — Patient Instructions (Addendum)
.   You are due for a Td (tetanus-diptheria vaccine) which protects you from tetanus and whooping cough. Please check with your pharmacy plan or pharmacy regarding coverage for this vaccine.    Due to history of pre-cancerous colon polyps, you will be due for follow up colonoscopy in 2020

## 2018-03-25 NOTE — Progress Notes (Signed)
Patient: Lawrence Ewing Male    DOB: 25-Aug-1945   72 y.o.   MRN: [id number] Visit Date: 03/25/2018  Today's Provider: Lelon Huh, MD   Chief Complaint  Patient presents with  . Sleep Apnea  . Hyperlipidemia  . Anxiety   Subjective:   Patient saw [facility] for AWV on 03/07/2018.  HPI    Lipid/Cholesterol, Follow-up:   Last seen for this 1 years ago.  Management since that visit includes; labs checked, no changes. Diet controlled.  Last Lipid Panel:    Component Value Date/Time   CHOL 180 03/16/2015 1000   TRIG 120 03/16/2015 1000   HDL 39 (L) 03/16/2015 1000   CHOLHDL 4.6 03/16/2015 1000   LDLCALC 117 (H) 03/16/2015 1000    He reports good compliance with treatment. He is not having side effects.   Wt Readings from Last 3 Encounters:  03/25/18 201 lb (91.2 kg)  03/07/18 199 lb 9.6 oz (90.5 kg)  07/19/17 195 lb (88.5 kg)    ------------------------------------------------------------------------  OSA From 03/27/2016-no changes. Doing well and consistently using CPAP. Patient reports good compliance with CPAP usage. He uses every night, sleeps well as long as he uses. Feels much better rested and has more energy since being on CPAP.   Anxiety Current treatment Celexa. Patient reports good compliance with treatment,. Good tolerance and good symptom control.   Vitamin D Deficiency From 03/25/2015-no changes. Continue daily vitamin D supplements. Patient reports good compliance with treatment. Lab Results  Component Value Date   VD25OH 22.9 (L) 03/16/2015      Allergies  Allergen Reactions  . Sumatriptan Succinate Other (See Comments)    Other Reaction: FELT COLD &PULSE SLOWED Other Reaction: FELT COLD &PULSE SLOWED Other reaction(s): Other (See Comments) Other Reaction: FELT COLD &PULSE SLOWED  . Temazepam Other (See Comments)    Other Reaction: PROFOUND DROWSINESS  . Zolpidem Other (See Comments)    Other Reaction: PROLONGED SEDATION Other  Reaction: PROLONGED SEDATION Other reaction(s): Other (See Comments) Other Reaction: PROLONGED SEDATION     Current Outpatient Medications:  .  Cholecalciferol (VITAMIN D) 2000 UNITS CAPS, Take 1 capsule by mouth daily., Disp: , Rfl:  .  citalopram (CELEXA) 10 MG tablet, Take 10 mg by mouth daily., Disp: , Rfl:  .  citalopram (CELEXA) 20 MG tablet, Take 20 mg by mouth daily., Disp: , Rfl:  .  MULTIPLE VITAMINS-MINERALS PO, Take 1 tablet by mouth daily., Disp: , Rfl:  .  omeprazole (PRILOSEC) 20 MG capsule, Take 1 capsule by mouth daily., Disp: , Rfl:  .  Probiotic Product (PROBIOTIC PO), Take by mouth daily., Disp: , Rfl:  .  tamsulosin (FLOMAX) 0.4 MG CAPS capsule, Take 0.4 mg by mouth daily., Disp: , Rfl:  .  vitamin E 400 UNIT capsule, Take 400 Units by mouth daily., Disp: , Rfl:   Review of Systems  Constitutional: Negative for appetite change, chills and fever.  Respiratory: Negative for chest tightness, shortness of breath and wheezing.   Cardiovascular: Negative for chest pain and palpitations.  Gastrointestinal: Negative for abdominal pain, nausea and vomiting.    Social History   Tobacco Use  . Smoking status: Former Smoker    Types: Cigarettes  . Smokeless tobacco: Never Used  . Tobacco comment: quit @ age 45  Substance Use Topics  . Alcohol use: Yes    Alcohol/week: 0.0 standard drinks    Comment: occasionally - monthly   Objective:   BP 135/75 (BP Location:  Left Arm, Patient Position: Sitting, Cuff Size: Large)   Pulse 85   Temp 97.8 F (36.6 C) (Oral)   Resp 16   Wt 201 lb (91.2 kg)   SpO2 98% Comment: room air  BMI 28.03 kg/m  Vitals:   03/25/18 1417  BP: 135/75  Pulse: 85  Resp: 16  Temp: 97.8 F (36.6 C)  TempSrc: Oral  SpO2: 98%  Weight: 201 lb (91.2 kg)     Physical Exam   General Appearance:    Alert, cooperative, no distress  Eyes:    PERRL, conjunctiva/corneas clear, EOM's intact       Lungs:     Clear to auscultation bilaterally,  respirations unlabored  Heart:    Regular rate and rhythm  Neurologic:   Awake, alert, oriented x 3. No apparent focal neurological           defect.          Assessment & Plan:     1. Anxiety Doing well with 30mg  citalopram daily.   2. H/O adenomatous polyp of colon Next colonoscopy due 2020   3. Obstructive sleep apnea of adult Using CPAP consistently every night which is significantly improving quality of life and medically benefiting patient.   4. Hyperlipidemia, unspecified hyperlipidemia type Diet controlled.  - Lipid panel  5. Vitamin D deficiency On vitamin d supplements.  - VITAMIN D 25 Hydroxy (Vit-D Deficiency, Fractures)  6. Need for prophylactic vaccination using tetanus and diphtheria toxoids adsorbed (Td) vaccine Counseled to check with pharmacy regarding cost and insurance coverage of Td.   7. Need for hepatitis C screening test  - Hepatitis C antibody  8. Need for influenza vaccination  - Flu vaccine HIGH DOSE PF (Fluzone High dose)       Lelon Huh, MD  [facility]

## 2018-03-25 NOTE — Telephone Encounter (Signed)
Notes were faxed

## 2018-03-26 DIAGNOSIS — E559 Vitamin D deficiency, unspecified: Secondary | ICD-10-CM | POA: Diagnosis not present

## 2018-03-26 DIAGNOSIS — Z1159 Encounter for screening for other viral diseases: Secondary | ICD-10-CM | POA: Diagnosis not present

## 2018-03-26 DIAGNOSIS — E785 Hyperlipidemia, unspecified: Secondary | ICD-10-CM | POA: Diagnosis not present

## 2018-03-27 LAB — LIPID PANEL
CHOLESTEROL TOTAL: 199 mg/dL (ref 100–199)
Chol/HDL Ratio: 5.1 ratio — ABNORMAL HIGH (ref 0.0–5.0)
HDL: 39 mg/dL — ABNORMAL LOW (ref >39)
LDL CALC: 139 mg/dL — AB (ref 0–99)
TRIGLYCERIDES: 107 mg/dL (ref 0–149)
VLDL CHOLESTEROL CAL: 21 mg/dL (ref 5–40)

## 2018-03-27 LAB — VITAMIN D 25 HYDROXY (VIT D DEFICIENCY, FRACTURES): VIT D 25 HYDROXY: 30.3 ng/mL (ref 30.0–100.0)

## 2018-03-27 LAB — HEPATITIS C ANTIBODY: Hep C Virus Ab: <0.1 s/co ratio (ref 0.0–0.9)

## 2018-05-09 DIAGNOSIS — R972 Elevated prostate specific antigen [PSA]: Secondary | ICD-10-CM | POA: Diagnosis not present

## 2018-07-25 ENCOUNTER — Ambulatory Visit (INDEPENDENT_AMBULATORY_CARE_PROVIDER_SITE_OTHER): Payer: Medicare Other | Admitting: Family Medicine

## 2018-07-25 ENCOUNTER — Encounter: Payer: Self-pay | Admitting: Family Medicine

## 2018-07-25 VITALS — BP 140/82 | HR 64 | Temp 97.9°F | Resp 16 | Ht 71.0 in | Wt 194.0 lb

## 2018-07-25 DIAGNOSIS — K625 Hemorrhage of anus and rectum: Secondary | ICD-10-CM

## 2018-07-25 DIAGNOSIS — Z86010 Personal history of colonic polyps: Secondary | ICD-10-CM

## 2018-07-25 DIAGNOSIS — E785 Hyperlipidemia, unspecified: Secondary | ICD-10-CM

## 2018-07-25 DIAGNOSIS — G43B0 Ophthalmoplegic migraine, not intractable: Secondary | ICD-10-CM | POA: Diagnosis not present

## 2018-07-25 MED ORDER — TOPIRAMATE 50 MG PO TABS: ORAL_TABLET | ORAL | 3 refills | Status: DC

## 2018-07-25 NOTE — Progress Notes (Signed)
Patient: Lawrence Ewing Male    DOB: [birth year]   73 y.o.   MRN: [id number] Visit Date: 07/25/2018  Today's Provider: Lelon Huh, MD   Chief Complaint  Patient presents with  . Hyperlipidemia  . Rectal Bleeding   Subjective:     Hyperlipidemia  This is a chronic (Pt's cholesterol increased to 199 on 03/26/2018.  He was advised to work on lifestyle changes. ) problem. The problem is controlled. Recent lipid tests were reviewed and are high. Current antihyperlipidemic treatment includes diet change and exercise. There are no compliance problems.   Rectal Bleeding   The problem occurs occasionally (Pt states it happens about twice a month.). Associated symptoms include abdominal pain, hemorrhoids and headaches. Pertinent negatives include no diarrhea, no nausea, no rectal pain and no vomiting.   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 Starts with visual zig zagging, followed by headache in the back of his head. He states that wine is  A frequent trigger, and had been drinking wine more frequently over the Holidays.   Lab Results  Component Value Date   CHOL 199 03/26/2018   CHOL 180 03/16/2015   CHOL 190 03/10/2014   Lab Results  Component Value Date   HDL 39 (L) 03/26/2018   HDL 39 (L) 03/16/2015   HDL 42 03/10/2014   Lab Results  Component Value Date   LDLCALC 139 (H) 03/26/2018   LDLCALC 117 (H) 03/16/2015   LDLCALC 132 03/10/2014   Lab Results  Component Value Date   TRIG 107 03/26/2018   TRIG 120 03/16/2015   TRIG 80 03/10/2014   Lab Results  Component Value Date   CHOLHDL 5.1 (H) 03/26/2018   CHOLHDL 4.6 03/16/2015   No results found for: LDLDIRECT  Wt Readings from Last 3 Encounters:  07/25/18 194 lb (88 kg)  03/25/18 201 lb (91.2 kg)  03/07/18 199 lb 9.6 oz (90.5 kg)     Allergies  Allergen Reactions  . Sumatriptan Succinate Other (See Comments)    Other Reaction: FELT COLD &PULSE  SLOWED Other Reaction: FELT COLD &PULSE SLOWED Other reaction(s): Other (See Comments) Other Reaction: FELT COLD &PULSE SLOWED  . Temazepam Other (See Comments)    Other Reaction: PROFOUND DROWSINESS  . Zolpidem Other (See Comments)    Other Reaction: PROLONGED SEDATION Other Reaction: PROLONGED SEDATION Other reaction(s): Other (See Comments) Other Reaction: PROLONGED SEDATION     Current Outpatient Medications:  .  Cholecalciferol (VITAMIN D) 2000 UNITS CAPS, Take 1 capsule by mouth daily., Disp: , Rfl:  .  citalopram (CELEXA) 10 MG tablet, Take 10 mg by mouth daily., Disp: , Rfl:  .  citalopram (CELEXA) 20 MG tablet, Take 20 mg by mouth daily., Disp: , Rfl:  .  MULTIPLE VITAMINS-MINERALS PO, Take 1 tablet by mouth daily., Disp: , Rfl:  .  omeprazole (PRILOSEC) 20 MG capsule, Take 1 capsule by mouth daily., Disp: , Rfl:  .  Probiotic Product (PROBIOTIC PO), Take by mouth daily., Disp: , Rfl:  .  tamsulosin (FLOMAX) 0.4 MG CAPS capsule, Take 0.4 mg by mouth daily., Disp: , Rfl:  .  vitamin E 400 UNIT capsule, Take 400 Units by mouth daily., Disp: , Rfl:   Review of Systems  Constitutional: Negative.   Respiratory: Negative.   Cardiovascular: Negative.   Gastrointestinal: Positive for abdominal distention, abdominal pain, anal bleeding, constipation, hematochezia and hemorrhoids. Negative for blood in stool, diarrhea,  nausea, rectal pain and vomiting.       Has a history IBS.   Neurological: Positive for headaches. Negative for dizziness and light-headedness.       Pt reports increasing occular migraines.     Social History   Tobacco Use  . Smoking status: Former Smoker    Types: Cigarettes    Last attempt to quit: 1969    Years since quitting: 51.0  . Smokeless tobacco: Never Used  . Tobacco comment: quit @ age 75  Substance Use Topics  . Alcohol use: Yes    Alcohol/week: 0.0 standard drinks    Comment: occasionally - monthly      Objective:   BP 140/82 (BP  Location: Right Arm, Patient Position: Sitting, Cuff Size: Large)   Pulse 64   Temp 97.9 F (36.6 C) (Oral)   Resp 16   Ht 5\' 11"  (1.803 m)   Wt 194 lb (88 kg)   SpO2 98%   BMI 27.06 kg/m  Vitals:   07/25/18 1116  BP: 140/82  Pulse: 64  Resp: 16  Temp: 97.9 F (36.6 C)  TempSrc: Oral  SpO2: 98%  Weight: 194 lb (88 kg)  Height: 5\' 11"  (1.803 m)     Physical Exam   General Appearance:    Alert, cooperative, no distress  Eyes:    PERRL, conjunctiva/corneas clear, EOM's intact       Lungs:     Clear to auscultation bilaterally, respirations unlabored  Heart:    Regular rate and rhythm  Neurologic:   Awake, alert, oriented x 3. No apparent focal neurological           defect.           Assessment & Plan    1. Ophthalmoplegic migraine, not intractable Likely increased frequent due to consumption of wine, which he has now discontinued. If headaches do not resolve then will try- topiramate (TOPAMAX) 50 MG tablet; Take 1/2 tablet at bedtime for 6 days, then increase to 1 tablet daily. May increase to 2 tablets daily after 6 days if needed  Dispense: 30 tablet; Refill: 3  2. Hyperlipidemia, unspecified hyperlipidemia type Has been working on eating healthier diet.  - Lipid panel  3. H/O adenomatous polyp of colon Is due for follow up colonoscopy last done in 2015 by Dr. Bonnita Nasuti at [facility].  - Ambulatory referral to Gastroenterology  4. Blood per rectum  - Ambulatory referral to Gastroenterology     Lelon Huh, MD  [facility]

## 2018-07-25 NOTE — Patient Instructions (Addendum)
.   Please bring all of your medications to every appointment so we can make sure that our medication list is the same as yours.   . Please go to the lab draw station in [address] on the second floor of [facility]  When you are fasting

## 2018-07-28 DIAGNOSIS — E785 Hyperlipidemia, unspecified: Secondary | ICD-10-CM | POA: Diagnosis not present

## 2018-07-29 ENCOUNTER — Telehealth: Payer: Self-pay

## 2018-07-29 LAB — LIPID PANEL
CHOL/HDL RATIO: 4.8 ratio (ref 0.0–5.0)
Cholesterol, Total: 183 mg/dL (ref 100–199)
HDL: 38 mg/dL — AB (ref >39)
LDL Calculated: 120 mg/dL — ABNORMAL HIGH (ref 0–99)
Triglycerides: 123 mg/dL (ref 0–149)
VLDL Cholesterol Cal: 25 mg/dL (ref 5–40)

## 2018-07-29 NOTE — Telephone Encounter (Signed)
-----   Message from Birdie Sons, MD sent at 07/29/2018  7:58 AM EST ----- Cholesterol is better,but should be lower to prevent heart problems and strokes. LDL is 120 and needs to be under 100. Recommend he start pravastatin 40mg  once a day, #90, rf x 1. Follow up o.v. and labs in 3 months.

## 2018-07-29 NOTE — Telephone Encounter (Signed)
LMTCB 07/29/2018  Thanks,   -Mickel Baas

## 2018-07-30 NOTE — Telephone Encounter (Signed)
Pt advised.  He does not want to start pravastatin at this time.  He still wants to continue to work on lifestyle changes.  Do you still want to see him back in three months?  Thanks,   -Mickel Baas

## 2018-07-30 NOTE — Telephone Encounter (Signed)
He just needs to recheck cholesterol in 4-5 months. He can call in April or may for lab order.

## 2018-07-31 DIAGNOSIS — H2513 Age-related nuclear cataract, bilateral: Secondary | ICD-10-CM | POA: Diagnosis not present

## 2018-08-01 NOTE — Telephone Encounter (Signed)
Advised patient of results.  

## 2018-08-01 NOTE — Telephone Encounter (Signed)
Left message to call back  

## 2018-08-05 ENCOUNTER — Telehealth: Payer: Self-pay | Admitting: Family Medicine

## 2018-08-05 NOTE — Telephone Encounter (Signed)
Copied from [place]. Topic: Referral - Request for Referral >> Aug 05, 2018  9:31 AM Lionel December wrote: Has patient seen PCP for this complaint? Yes.   *If NO, is insurance requiring patient see PCP for this issue before PCP can refer them? Referral for which specialty: Neurology Preferred provider/office: Cheral Bay, MD /  [place] Reason for referral: headaches

## 2018-08-07 ENCOUNTER — Telehealth: Payer: Self-pay | Admitting: Family Medicine

## 2018-08-07 DIAGNOSIS — G43B0 Ophthalmoplegic migraine, not intractable: Secondary | ICD-10-CM

## 2018-08-07 NOTE — Telephone Encounter (Signed)
Please advise if ok to place an order for a referral.

## 2018-08-07 NOTE — Telephone Encounter (Signed)
Pt is requesting referral to see Dr Cheral Bay for his migraines.She is a Garment/textile technologist at [facility]

## 2018-08-08 NOTE — Telephone Encounter (Signed)
Order entered

## 2018-11-14 DIAGNOSIS — G43809 Other migraine, not intractable, without status migrainosus: Secondary | ICD-10-CM | POA: Diagnosis not present

## 2018-11-14 DIAGNOSIS — R399 Unspecified symptoms and signs involving the genitourinary system: Secondary | ICD-10-CM | POA: Diagnosis not present

## 2019-01-02 ENCOUNTER — Other Ambulatory Visit: Payer: Self-pay | Admitting: Family Medicine

## 2019-01-02 ENCOUNTER — Telehealth: Payer: Self-pay | Admitting: Family Medicine

## 2019-01-02 NOTE — Telephone Encounter (Signed)
Pt has a doctor that he sees in [place] for psychiatry and they have not been able to get in touch with her for the refills on his Citalopram  She use to give him the 10 mg and 20 mg tablets to make a total of 30 mg's a day.  He said the pharmacy could not get in touch with her either.  He wants to know if Dr. Caryn Section will prescribe these for him so he will not run out  [facility]  CB#  [phone]  Thanks terri

## 2019-01-19 ENCOUNTER — Ambulatory Visit (INDEPENDENT_AMBULATORY_CARE_PROVIDER_SITE_OTHER): Payer: Medicare Other | Admitting: Family Medicine

## 2019-01-19 ENCOUNTER — Other Ambulatory Visit: Payer: Self-pay

## 2019-01-19 ENCOUNTER — Encounter: Payer: Self-pay | Admitting: Family Medicine

## 2019-01-19 VITALS — BP 134/68 | HR 84 | Temp 98.1°F | Resp 16 | Wt 196.0 lb

## 2019-01-19 DIAGNOSIS — R079 Chest pain, unspecified: Secondary | ICD-10-CM | POA: Diagnosis not present

## 2019-01-19 DIAGNOSIS — E785 Hyperlipidemia, unspecified: Secondary | ICD-10-CM

## 2019-01-19 DIAGNOSIS — Z125 Encounter for screening for malignant neoplasm of prostate: Secondary | ICD-10-CM

## 2019-01-19 DIAGNOSIS — B078 Other viral warts: Secondary | ICD-10-CM | POA: Diagnosis not present

## 2019-01-19 NOTE — Progress Notes (Signed)
Patient: Lawrence Ewing Male    DOB: 11-Jul-1946   73 y.o.   MRN: [id number] Visit Date: 01/19/2019  Today's Provider: Lelon Huh, MD   Chief Complaint  Patient presents with  . Hyperlipidemia  . Gastroesophageal Reflux  . Skin Problem    Left arm; Pt is thinks he may have a wart.    Subjective:    Follow up hyperlipidemia. This is a chronic problem. The problem is uncontrolled. Recent lipid tests were reviewed and are high. He has some episodes of left sided chest pain which he thought was due to refux, denies, focal sensory loss, focal weakness, leg pain, myalgias or shortness of breath. Current antihyperlipidemic treatment includes diet change and exercise previously recommended statin, but he wanted to try D&E longer. There are no compliance problems.   Gastroesophageal Reflux. No specific trigger for chest pain, lasts for a few minutes at a time and usually resolve spontaneously. Does not worsen when he lays down or eats. Not specifically exertional.   Wt Readings from Last 3 Encounters:  07/25/18 194 lb (88 kg)  03/25/18 201 lb (91.2 kg)  03/07/18 199 lb 9.6 oz (90.5 kg)   Lab Results  Component Value Date   CHOL 183 07/28/2018   CHOL 199 03/26/2018   CHOL 180 03/16/2015   Lab Results  Component Value Date   HDL 38 (L) 07/28/2018   HDL 39 (L) 03/26/2018   HDL 39 (L) 03/16/2015   Lab Results  Component Value Date   LDLCALC 120 (H) 07/28/2018   LDLCALC 139 (H) 03/26/2018   LDLCALC 117 (H) 03/16/2015   Lab Results  Component Value Date   TRIG 123 07/28/2018   TRIG 107 03/26/2018   TRIG 120 03/16/2015   Lab Results  Component Value Date   CHOLHDL 4.8 07/28/2018   CHOLHDL 5.1 (H) 03/26/2018   CHOLHDL 4.6 03/16/2015   The 10-year ASCVD risk score Mikey Bussing DC Jr., et al., 2013) is: 25.1%   Values used to calculate the score:     Age: 1 years     Sex: Male     Is Non-Hispanic African American: No     Diabetic: No     Tobacco smoker: No     Systolic  Blood Pressure: 134 mmHg     Is BP treated: No     HDL Cholesterol: 38 mg/dL     Total Cholesterol: 183 mg/dL     Allergies  Allergen Reactions  . Sumatriptan Succinate Other (See Comments)    Other Reaction: FELT COLD &PULSE SLOWED Other Reaction: FELT COLD &PULSE SLOWED Other reaction(s): Other (See Comments) Other Reaction: FELT COLD &PULSE SLOWED  . Temazepam Other (See Comments)    Other Reaction: PROFOUND DROWSINESS  . Zolpidem Other (See Comments)    Other Reaction: PROLONGED SEDATION Other Reaction: PROLONGED SEDATION Other reaction(s): Other (See Comments) Other Reaction: PROLONGED SEDATION     Current Outpatient Medications:  .  Cholecalciferol (VITAMIN D) 2000 UNITS CAPS, Take 1 capsule by mouth daily., Disp: , Rfl:  .  citalopram (CELEXA) 10 MG tablet, TAKE 1 TABLET BY MOUTH DAILY, Disp: 90 tablet, Rfl: 4 .  citalopram (CELEXA) 20 MG tablet, TAKE 1 TABLET BY MOUTH DAILY, Disp: 90 tablet, Rfl: 4 .  MULTIPLE VITAMINS-MINERALS PO, Take 1 tablet by mouth daily., Disp: , Rfl:  .  omeprazole (PRILOSEC) 20 MG capsule, Take 1 capsule by mouth daily., Disp: , Rfl:  .  Probiotic Product (PROBIOTIC PO), Take  by mouth daily., Disp: , Rfl:  .  topiramate (TOPAMAX) 50 MG tablet, Take 1/2 tablet at bedtime for 6 days, then increase to 1 tablet daily. May increase to 2 tablets daily after 6 days if needed, Disp: 30 tablet, Rfl: 3 .  vitamin E 400 UNIT capsule, Take 400 Units by mouth daily., Disp: , Rfl:   Review of Systems  Constitutional: Negative.   Respiratory: Negative.  Negative for shortness of breath.   Cardiovascular: Negative for chest pain (Only when he had trouble with reflux.  PT states this has resolved. ), palpitations and leg swelling.  Gastrointestinal: Negative.   Musculoskeletal: Negative for myalgias.  Skin: Negative for color change, pallor, rash and wound.  Neurological: Negative for dizziness, focal weakness, light-headedness and headaches.    Social  History   Tobacco Use  . Smoking status: Former Smoker    Types: Cigarettes    Quit date: 1969    Years since quitting: 51.5  . Smokeless tobacco: Never Used  . Tobacco comment: quit @ age 46  Substance Use Topics  . Alcohol use: Yes    Alcohol/week: 0.0 standard drinks    Comment: occasionally - monthly      Objective:    Vitals:   01/19/19 1439  BP: 134/68  Pulse: 84  Resp: 16  Temp: 98.1 F (36.7 C)  TempSrc: Oral  Weight: 196 lb (88.9 kg)    Physical Exam   General Appearance:    Alert, cooperative, no distress  Eyes:    PERRL, conjunctiva/corneas clear, EOM's intact       Lungs:     Clear to auscultation bilaterally, respirations unlabored  Heart:    Regular rate and rhythm  Neurologic:   Awake, alert, oriented x 3. No apparent focal neurological           defect.   Skin   Small exophytic warty lesion anterior left forearm.     The 10-year ASCVD risk score Mikey Bussing DC Brooke Bonito., et al., 2013) is: 25.1%   Values used to calculate the score:     Age: 49 years     Sex: Male     Is Non-Hispanic African American: No     Diabetic: No     Tobacco smoker: No     Systolic Blood Pressure: 382 mmHg     Is BP treated: No     HDL Cholesterol: 38 mg/dL     Total Cholesterol: 183 mg/dL     Assessment & Plan    1. Hyperlipidemia, unspecified hyperlipidemia type Previously refused statin therapy  - Comprehensive metabolic panel - Lipid panel  2. Prostate cancer screening  - PSA Total (Reflex To Free)  3. Chest pain, unspecified type Not typical cardiac or GI, but not sx since starting PPI a few days ago. He is at elevated risk for CAD - NM Myocar Multi W/Spect W/Wall Motion / EF; Future  4. Other viral warts Cryotherapy x 3 with Cryopen, call for derm referral if not healed within a month.      Lelon Huh, MD  [facility]

## 2019-01-19 NOTE — Patient Instructions (Signed)
.   Please review the attached list of medications and notify my office if there are any errors.   . Please bring all of your medications to every appointment so we can make sure that our medication list is the same as yours.   . We will have flu vaccines available after Labor Day. Please go to your pharmacy or call the office in early September to schedule you flu shot.   

## 2019-01-20 DIAGNOSIS — Z125 Encounter for screening for malignant neoplasm of prostate: Secondary | ICD-10-CM | POA: Diagnosis not present

## 2019-01-20 DIAGNOSIS — E785 Hyperlipidemia, unspecified: Secondary | ICD-10-CM | POA: Diagnosis not present

## 2019-01-21 LAB — COMPREHENSIVE METABOLIC PANEL
ALT: 23 IU/L (ref 0–44)
AST: 19 IU/L (ref 0–40)
Albumin/Globulin Ratio: 1.5 (ref 1.2–2.2)
Albumin: 4 g/dL (ref 3.7–4.7)
Alkaline Phosphatase: 72 IU/L (ref 39–117)
BUN/Creatinine Ratio: 13 (ref 10–24)
BUN: 12 mg/dL (ref 8–27)
Bilirubin Total: 0.5 mg/dL (ref 0.0–1.2)
CO2: 22 mmol/L (ref 20–29)
Calcium: 9.2 mg/dL (ref 8.6–10.2)
Chloride: 104 mmol/L (ref 96–106)
Creatinine, Ser: 0.93 mg/dL (ref 0.76–1.27)
GFR calc Af Amer: 94 mL/min/{1.73_m2} (ref >59)
GFR calc non Af Amer: 81 mL/min/{1.73_m2} (ref >59)
Globulin, Total: 2.6 g/dL (ref 1.5–4.5)
Glucose: 106 mg/dL — ABNORMAL HIGH (ref 65–99)
Potassium: 4.7 mmol/L (ref 3.5–5.2)
Sodium: 141 mmol/L (ref 134–144)
Total Protein: 6.6 g/dL (ref 6.0–8.5)

## 2019-01-21 LAB — LIPID PANEL
Chol/HDL Ratio: 4.6 ratio (ref 0.0–5.0)
Cholesterol, Total: 182 mg/dL (ref 100–199)
HDL: 40 mg/dL (ref >39)
LDL Calculated: 125 mg/dL — ABNORMAL HIGH (ref 0–99)
Triglycerides: 83 mg/dL (ref 0–149)
VLDL Cholesterol Cal: 17 mg/dL (ref 5–40)

## 2019-01-21 LAB — PSA TOTAL (REFLEX TO FREE): Prostate Specific Ag, Serum: 11.2 ng/mL — ABNORMAL HIGH (ref 0.0–4.0)

## 2019-01-22 ENCOUNTER — Telehealth: Payer: Self-pay

## 2019-01-22 NOTE — Telephone Encounter (Signed)
Patient called stating that Dr. Caryn Section ordered a stress test for him. His appointment was scheduled tomorrow. Patient was informed by the imaging department that they are not doing the treadmill part due to COVID. Patient states that they will only be doing the chemical part where they inject dyes into him. Patient is not sure about having this done, and he says this is not what Dr. Caryn Section discussed. Patient plans to call the imaging department back to reschedule this appointment until he is able to see what Dr. Caryn Section recommends. Please call patient.

## 2019-01-23 ENCOUNTER — Ambulatory Visit: Payer: Medicare Other

## 2019-01-23 DIAGNOSIS — L821 Other seborrheic keratosis: Secondary | ICD-10-CM | POA: Diagnosis not present

## 2019-01-23 DIAGNOSIS — D485 Neoplasm of uncertain behavior of skin: Secondary | ICD-10-CM | POA: Diagnosis not present

## 2019-03-11 NOTE — Progress Notes (Signed)
Subjective:   Lawrence Ewing is a 73 y.o. male who presents for Medicare Annual/Subsequent preventive examination.    This visit is being conducted through telemedicine due to the COVID-19 pandemic. This patient has given me verbal consent via doximity to conduct this visit, patient states they are participating from their home address. Some vital signs may be absent or patient reported.    Patient identification: identified by name, DOB, and current address  Review of Systems:  N/A  Cardiac Risk Factors include: advanced age (>67men, >79 women);male gender;dyslipidemia     Objective:    Vitals: There were no vitals taken for this visit.  There is no height or weight on file to calculate BMI. Unable to obtain vitals due to visit being conducted via telephonically.   Advanced Directives 03/12/2019 03/12/2019 03/07/2018 12/16/2014  Does Patient Have a Medical Advance Directive? Yes Yes Yes Yes  Type of Paramedic of [place];Living will [place];Living will [place];Living will [place];Living will  Copy of [place] in Chart? No - copy requested Yes - validated most recent copy scanned in chart (See row information) No - copy requested -    Tobacco Social History   Tobacco Use  Smoking Status Former Smoker  . Types: Cigarettes  . Quit date: 67  . Years since quitting: 51.6  Smokeless Tobacco Never Used  Tobacco Comment   quit @ age 19     Counseling given: Not Answered Comment: quit @ age 27   Clinical Intake:  Pre-visit preparation completed: Yes  Pain : No/denies pain Pain Score: 0-No pain     Nutritional Risks: None Diabetes: No  How often do you need to have someone help you when you read instructions, pamphlets, or other written materials from your doctor or pharmacy?: 1 - Never  Interpreter Needed?: No  Information entered by :: [facility], LPN  Past  Medical History:  Diagnosis Date  . Hyperlipidemia    Past Surgical History:  Procedure Laterality Date  . EYE SURGERY  1976  . TONSILLECTOMY  1957   Family History  Problem Relation Age of Onset  . Hypertension Mother   . Arthritis Mother   . Stroke Father   . Prostate cancer Father   . Allergies Brother    Social History   Socioeconomic History  . Marital status: Married    Spouse name: Not on file  . Number of children: 3  . Years of education: Not on file  . Highest education level: Bachelor's degree (e.g., BA, AB, BS)  Occupational History  . 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 Financial resource strain: Not hard at all  . Food insecurity    Worry: Never true    Inability: Never true  . Transportation needs    Medical: No    Non-medical: No  Tobacco Use  . Smoking status: Former Smoker    Types: Cigarettes    Quit date: 1969    Years since quitting: 51.6  . Smokeless tobacco: Never Used  . Tobacco comment: quit @ age 63  Substance and Sexual Activity  . Alcohol use: Yes    Alcohol/week: 0.0 standard drinks    Comment: occasionally - monthly  . Drug use: No  . Sexual activity: Not on file  Lifestyle  . Physical activity    Days per week: 0 days    Minutes per  session: 0 min  . Stress: Not at all  Relationships  . Social Herbalist on phone: Patient refused    Gets together: Patient refused    Attends religious service: Patient refused    Active member of club or organization: Patient refused    Attends meetings of clubs or organizations: Patient refused    Relationship status: Patient refused  Other Topics Concern  . Not on file  Social History Narrative  . Not on file    Outpatient Encounter Medications as of 03/12/2019  Medication Sig  . Cholecalciferol (VITAMIN D) 2000 UNITS CAPS Take 1 capsule by mouth daily.  . citalopram (CELEXA) 10 MG tablet TAKE 1 TABLET BY MOUTH DAILY  .  citalopram (CELEXA) 20 MG tablet TAKE 1 TABLET BY MOUTH DAILY  . MULTIPLE VITAMINS-MINERALS PO Take 1 tablet by mouth daily.  Marland Kitchen omeprazole (PRILOSEC) 20 MG capsule Take 1 capsule by mouth daily.  . Probiotic Product (PROBIOTIC PO) Take by mouth daily.  . tamsulosin (FLOMAX) 0.4 MG CAPS capsule Take 0.4 mg by mouth daily.   . vitamin E 400 UNIT capsule Take 400 Units by mouth daily.  Marland Kitchen topiramate (TOPAMAX) 50 MG tablet Take 1/2 tablet at bedtime for 6 days, then increase to 1 tablet daily. May increase to 2 tablets daily after 6 days if needed (Patient not taking: Reported on 01/19/2019)   No facility-administered encounter medications on file as of 03/12/2019.     Activities of Daily Living In your present state of health, do you have any difficulty performing the following activities: 03/12/2019  Hearing? N  Vision? N  Difficulty concentrating or making decisions? N  Walking or climbing stairs? N  Dressing or bathing? N  Doing errands, shopping? N  Preparing Food and eating ? N  Using the Toilet? N  In the past six months, have you accidently leaked urine? N  Do you have problems with loss of bowel control? N  Managing your Medications? N  Managing your Finances? N  Housekeeping or managing your Housekeeping? N  Some recent data might be hidden    Patient Care Team: Birdie Sons, MD as PCP - General (Family Medicine) Thomes Lolling, MD as Referring Physician (Urology) Jerilynn Som, MD as Consulting Physician (Psychiatry) Dorna Bloom, MD as Referring Physician (Gastroenterology)   Assessment:   This is a routine wellness examination for [facility].  Exercise Activities and Dietary recommendations Current Exercise Habits: Home exercise routine, Type of exercise: walking, Time (Minutes): 45, Frequency (Times/Week): 6, Weekly Exercise (Minutes/Week): 270, Intensity: Mild, Exercise limited by: None identified  Goals    . DIET - REDUCE SUGAR INTAKE     Recommend to cut back  on sweets and desserts in daily diet to none.        Fall Risk: Fall Risk  03/12/2019 03/07/2018 03/27/2016 03/25/2015  Falls in the past year? 0 No No Yes  Number falls in past yr: - - - 1  Injury with Fall? - - - Yes    FALL RISK PREVENTION PERTAINING TO THE HOME:  Any stairs in or around the home? No  If so, are there any without handrails? N/A  Home free of loose throw rugs in walkways, pet beds, electrical cords, etc? Yes  Adequate lighting in your home to reduce risk of falls? Yes   ASSISTIVE DEVICES UTILIZED TO PREVENT FALLS:  Life alert? No  Use of a cane, walker or w/c? No  Grab bars in the  bathroom? No  Shower chair or bench in shower? No  Elevated toilet seat or a handicapped toilet? No  TIMED UP AND GO:  Was the test performed? No .    Depression Screen PHQ 2/9 Scores 03/12/2019 03/07/2018 03/27/2016 03/25/2015  PHQ - 2 Score 0 0 0 0    Cognitive Function: Declined today.         Immunization History  Administered Date(s) Administered  . Influenza, High Dose Seasonal PF 05/26/2015, 03/27/2016, 03/25/2018  . Influenza-Unspecified 05/02/2017  . Pneumococcal Conjugate-13 03/25/2015  . Pneumococcal Polysaccharide-23 10/05/2011  . Tdap 02/24/2007  . Zoster Recombinat (Shingrix) 01/30/2018, 06/21/2018    Qualifies for Shingles Vaccine? Completed series  Tdap: Although this vaccine is not a covered service during a Wellness Exam, does the patient still wish to receive this vaccine today?  No .   Flu Vaccine: Due for Flu vaccine. Does the patient want to receive this vaccine today?  No .   Pneumococcal Vaccine: Completed series  Screening Tests Health Maintenance  Topic Date Due  . TETANUS/TDAP  02/23/2017  . COLONOSCOPY  10/15/2018  . INFLUENZA VACCINE  02/14/2019  . Hepatitis C Screening  Completed  . PNA vac Low Risk Adult  Completed   Cancer Screenings:  Colorectal Screening: Completed 10/14/13. Repeat every 5 years. Apt for this year was cancelled.  R/s to 10/2019.  Lung Cancer Screening: (Low Dose CT Chest recommended if Age 103-80 years, 30 pack-year currently smoking OR have quit w/in 15years.) does not qualify.   Additional Screening:  Hepatitis C Screening: Up to date  Vision Screening: Recommended annual ophthalmology exams for early detection of glaucoma and other disorders of the eye.  Dental Screening: Recommended annual dental exams for proper oral hygiene  Community Resource Referral:  CRR required this visit?  No        Plan:  I have personally reviewed and addressed the Medicare Annual Wellness questionnaire and have noted the following in the patient's chart:  A. Medical and social history B. Use of alcohol, tobacco or illicit drugs  C. Current medications and supplements D. Functional ability and status E.  Nutritional status F.  Physical activity G. Advance directives H. List of other physicians I.  Hospitalizations, surgeries, and ER visits in previous 12 months J.  [place] such as hearing and vision if needed, cognitive and depression L. Referrals and appointments   In addition, I have reviewed and discussed with patient certain preventive protocols, quality metrics, and best practice recommendations. A written personalized care plan for preventive services as well as general preventive health recommendations were provided to patient.   Glendora Score, [place]  [id number] Nurse Health Advisor   Nurse Notes: The influenza vaccine due at next in office visit. Declined a future order for a tetanus vaccine. Colonoscopy is scheduled for 10/2019.

## 2019-03-12 ENCOUNTER — Ambulatory Visit (INDEPENDENT_AMBULATORY_CARE_PROVIDER_SITE_OTHER): Payer: Medicare Other

## 2019-03-12 ENCOUNTER — Other Ambulatory Visit: Payer: Self-pay

## 2019-03-12 DIAGNOSIS — Z0000 Encounter for general adult medical examination without abnormal findings: Secondary | ICD-10-CM

## 2019-03-12 NOTE — Patient Instructions (Signed)
[name] , Thank you for taking time to come for your Medicare Wellness Visit. I appreciate your ongoing commitment to your health goals. Please review the following plan we discussed and let me know if I can assist you in the future.   Screening recommendations/referrals: Colonoscopy: Colonoscopy was rescheduled to 10/2019. Recommended yearly ophthalmology/optometry visit for glaucoma screening and checkup Recommended yearly dental visit for hygiene and checkup  Vaccinations: Influenza vaccine: Currently due  Pneumococcal vaccine: Completed series Tdap vaccine: Pt declines today.  Shingles vaccine: Pt declines today.     Advanced directives: Currently on file.   Conditions/risks identified: Continue to decrease the amount of sweets in daily diet.   Next appointment: 09/28/18 @ 9:00 AM with Dr Lawrence Ewing.   Preventive Care 73 Years and Older, Male Preventive care refers to lifestyle choices and visits with your health care provider that can promote health and wellness. What does preventive care include?  A yearly physical exam. This is also called an annual well check.  Dental exams once or twice a year.  Routine eye exams. Ask your health care provider how often you should have your eyes checked.  Personal lifestyle choices, including:  Daily care of your teeth and gums.  Regular physical activity.  Eating a healthy diet.  Avoiding tobacco and drug use.  Limiting alcohol use.  Practicing safe sex.  Taking low doses of aspirin every day.  Taking vitamin and mineral supplements as recommended by your health care provider. What happens during an annual well check? The services and screenings done by your health care provider during your annual well check will depend on your age, overall health, lifestyle risk factors, and family history of disease. Counseling  Your health care provider may ask you questions about your:  Alcohol use.  Tobacco use.  Drug use.   Emotional well-being.  Home and relationship well-being.  Sexual activity.  Eating habits.  History of falls.  Memory and ability to understand (cognition).  Work and work Statistician. Screening  You may have the following tests or measurements:  Height, weight, and BMI.  Blood pressure.  Lipid and cholesterol levels. These may be checked every 5 years, or more frequently if you are over 15 years old.  Skin check.  Lung cancer screening. You may have this screening every year starting at age 68 if you have a 30-pack-year history of smoking and currently smoke or have quit within the past 15 years.  Fecal occult blood test (FOBT) of the stool. You may have this test every year starting at age 87.  Flexible sigmoidoscopy or colonoscopy. You may have a sigmoidoscopy every 5 years or a colonoscopy every 10 years starting at age 28.  Prostate cancer screening. Recommendations will vary depending on your family history and other risks.  Hepatitis C blood test.  Hepatitis B blood test.  Sexually transmitted disease (STD) testing.  Diabetes screening. This is done by checking your blood sugar (glucose) after you have not eaten for a while (fasting). You may have this done every 1-3 years.  Abdominal aortic aneurysm (AAA) screening. You may need this if you are a current or former smoker.  Osteoporosis. You may be screened starting at age 9 if you are at high risk. Talk with your health care provider about your test results, treatment options, and if necessary, the need for more tests. Vaccines  Your health care provider may recommend certain vaccines, such as:  Influenza vaccine. This is recommended every year.  Tetanus, diphtheria,  and acellular pertussis (Tdap, Td) vaccine. You may need a Td booster every 10 years.  Zoster vaccine. You may need this after age 71.  Pneumococcal 13-valent conjugate (PCV13) vaccine. One dose is recommended after age 62.  Pneumococcal  polysaccharide (PPSV23) vaccine. One dose is recommended after age 4. Talk to your health care provider about which screenings and vaccines you need and how often you need them. This information is not intended to replace advice given to you by your health care provider. Make sure you discuss any questions you have with your health care provider. Document Released: 07/29/2015 Document Revised: 03/21/2016 Document Reviewed: 05/03/2015 Elsevier Interactive Patient Education  2017 [place] Prevention in the Home Falls can cause injuries. They can happen to people of all ages. There are many things you can do to make your home safe and to help prevent falls. What can I do on the outside of my home?  Regularly fix the edges of walkways and driveways and fix any cracks.  Remove anything that might make you trip as you walk through a door, such as a raised step or threshold.  Trim any bushes or trees on the path to your home.  Use bright outdoor lighting.  Clear any walking paths of anything that might make someone trip, such as rocks or tools.  Regularly check to see if handrails are loose or broken. Make sure that both sides of any steps have handrails.  Any raised decks and porches should have guardrails on the edges.  Have any leaves, snow, or ice cleared regularly.  Use sand or salt on walking paths during winter.  Clean up any spills in your garage right away. This includes oil or grease spills. What can I do in the bathroom?  Use night lights.  Install grab bars by the toilet and in the tub and shower. Do not use towel bars as grab bars.  Use non-skid mats or decals in the tub or shower.  If you need to sit down in the shower, use a plastic, non-slip stool.  Keep the floor dry. Clean up any water that spills on the floor as soon as it happens.  Remove soap buildup in the tub or shower regularly.  Attach bath mats securely with double-sided non-slip rug tape.   Do not have throw rugs and other things on the floor that can make you trip. What can I do in the bedroom?  Use night lights.  Make sure that you have a light by your bed that is easy to reach.  Do not use any sheets or blankets that are too big for your bed. They should not hang down onto the floor.  Have a firm chair that has side arms. You can use this for support while you get dressed.  Do not have throw rugs and other things on the floor that can make you trip. What can I do in the kitchen?  Clean up any spills right away.  Avoid walking on wet floors.  Keep items that you use a lot in easy-to-reach places.  If you need to reach something above you, use a strong step stool that has a grab bar.  Keep electrical cords out of the way.  Do not use floor polish or wax that makes floors slippery. If you must use wax, use non-skid floor wax.  Do not have throw rugs and other things on the floor that can make you trip. What can I do with my  stairs?  Do not leave any items on the stairs.  Make sure that there are handrails on both sides of the stairs and use them. Fix handrails that are broken or loose. Make sure that handrails are as long as the stairways.  Check any carpeting to make sure that it is firmly attached to the stairs. Fix any carpet that is loose or worn.  Avoid having throw rugs at the top or bottom of the stairs. If you do have throw rugs, attach them to the floor with carpet tape.  Make sure that you have a light switch at the top of the stairs and the bottom of the stairs. If you do not have them, ask someone to add them for you. What else can I do to help prevent falls?  Wear shoes that:  Do not have high heels.  Have rubber bottoms.  Are comfortable and fit you well.  Are closed at the toe. Do not wear sandals.  If you use a stepladder:  Make sure that it is fully opened. Do not climb a closed stepladder.  Make sure that both sides of the  stepladder are locked into place.  Ask someone to hold it for you, if possible.  Clearly mark and make sure that you can see:  Any grab bars or handrails.  First and last steps.  Where the edge of each step is.  Use tools that help you move around (mobility aids) if they are needed. These include:  Canes.  Walkers.  Scooters.  Crutches.  Turn on the lights when you go into a dark area. Replace any light bulbs as soon as they burn out.  Set up your furniture so you have a clear path. Avoid moving your furniture around.  If any of your floors are uneven, fix them.  If there are any pets around you, be aware of where they are.  Review your medicines with your doctor. Some medicines can make you feel dizzy. This can increase your chance of falling. Ask your doctor what other things that you can do to help prevent falls. This information is not intended to replace advice given to you by your health care provider. Make sure you discuss any questions you have with your health care provider. Document Released: 04/28/2009 Document Revised: 12/08/2015 Document Reviewed: 08/06/2014 Elsevier Interactive Patient Education  2017 [employer].

## 2019-04-27 ENCOUNTER — Other Ambulatory Visit: Payer: Self-pay | Admitting: Family Medicine

## 2019-04-28 DIAGNOSIS — Z23 Encounter for immunization: Secondary | ICD-10-CM | POA: Diagnosis not present

## 2019-07-07 ENCOUNTER — Ambulatory Visit (INDEPENDENT_AMBULATORY_CARE_PROVIDER_SITE_OTHER): Payer: Medicare Other | Admitting: Family Medicine

## 2019-07-07 ENCOUNTER — Other Ambulatory Visit: Payer: Self-pay

## 2019-07-07 ENCOUNTER — Encounter: Payer: Self-pay | Admitting: Family Medicine

## 2019-07-07 VITALS — BP 130/78 | HR 53 | Temp 96.8°F | Resp 18 | Wt 200.0 lb

## 2019-07-07 DIAGNOSIS — M6208 Separation of muscle (nontraumatic), other site: Secondary | ICD-10-CM

## 2019-07-07 NOTE — Progress Notes (Signed)
Patient: Lawrence Ewing Male    DOB: 01-Sep-1945   73 y.o.   MRN: [id number] Visit Date: 07/07/2019  Today's Provider: Lelon Huh, MD   Chief Complaint  Patient presents with  . Mass   Subjective:     HPI Abdominal mass: Patient complains of a mass in the center of his abdomen. He first noticed it 1 month ago. He report no change in size or pain. The mass is only noticeable when doing sit ups.    Allergies  Allergen Reactions  . Sumatriptan Succinate Other (See Comments)    Other Reaction: FELT COLD &PULSE SLOWED Other Reaction: FELT COLD &PULSE SLOWED Other reaction(s): Other (See Comments) Other Reaction: FELT COLD &PULSE SLOWED  . Temazepam Other (See Comments)    Other Reaction: PROFOUND DROWSINESS  . Zolpidem Other (See Comments)    Other Reaction: PROLONGED SEDATION Other Reaction: PROLONGED SEDATION Other reaction(s): Other (See Comments) Other Reaction: PROLONGED SEDATION     Current Outpatient Medications:  .  Cholecalciferol (VITAMIN D) 2000 UNITS CAPS, Take 1 capsule by mouth daily., Disp: , Rfl:  .  citalopram (CELEXA) 10 MG tablet, TAKE 1 TABLET BY MOUTH DAILY, Disp: 90 tablet, Rfl: 4 .  citalopram (CELEXA) 20 MG tablet, TAKE 1 TABLET BY MOUTH DAILY, Disp: 90 tablet, Rfl: 4 .  MULTIPLE VITAMINS-MINERALS PO, Take 1 tablet by mouth daily., Disp: , Rfl:  .  omeprazole (PRILOSEC) 20 MG capsule, Take 1 capsule by mouth daily., Disp: , Rfl:  .  Probiotic Product (PROBIOTIC PO), Take by mouth daily., Disp: , Rfl:  .  tamsulosin (FLOMAX) 0.4 MG CAPS capsule, TAKE 1 CAPSULE AT BEDTIME, Disp: 90 capsule, Rfl: 4 .  vitamin E 400 UNIT capsule, Take 400 Units by mouth daily., Disp: , Rfl:  .  topiramate (TOPAMAX) 50 MG tablet, Take 1/2 tablet at bedtime for 6 days, then increase to 1 tablet daily. May increase to 2 tablets daily after 6 days if needed (Patient not taking: Reported on 07/07/2019), Disp: 30 tablet, Rfl: 3  Review of Systems  Constitutional:  Negative for appetite change, chills and fever.  Respiratory: Negative for chest tightness, shortness of breath and wheezing.   Cardiovascular: Negative for chest pain and palpitations.  Gastrointestinal: Negative for abdominal pain, nausea and vomiting.    Social History   Tobacco Use  . Smoking status: Former Smoker    Types: Cigarettes    Quit date: 1969    Years since quitting: 52.0  . Smokeless tobacco: Never Used  . Tobacco comment: quit @ age 55  Substance Use Topics  . Alcohol use: Yes    Alcohol/week: 0.0 standard drinks    Comment: occasionally - monthly      Objective:   BP 130/78 (BP Location: Left Arm, Patient Position: Sitting, Cuff Size: Large)   Pulse (!) 53   Temp (!) 96.8 F (36 C) (Temporal)   Resp 18   Wt 200 lb (90.7 kg)   SpO2 97% Comment: room air  BMI 27.89 kg/m  Vitals:   07/07/19 0809  BP: 130/78  Pulse: (!) 53  Resp: 18  Temp: (!) 96.8 F (36 C)  TempSrc: Temporal  SpO2: 97%  Weight: 200 lb (90.7 kg)  Body mass index is 27.89 kg/m.   Physical Exam  General appearance: Well developed, well nourished male, cooperative and in no acute distress Head: Normocephalic, without obvious abnormality, atraumatic Abd: No tenderness. Small umbilical hernia noted. Patient reproduces mass with situp  revealing prominent diastasis recti. .      Assessment & Plan    1. Diastasis recti Reassurance. Discussed appropriate exercised. Printed patient information regarding condition.   The entirety of the information documented in the History of Present Illness, Review of Systems and Physical Exam were personally obtained by me. Portions of this information were initially documented by Meyer Cory, CMA and reviewed by me for thoroughness and accuracy.       Lelon Huh, MD  [facility]

## 2019-07-07 NOTE — Patient Instructions (Addendum)
. Please review the attached list of medications and notify my office if there are any errors.   . Please bring all of your medications to every appointment so we can make sure that our medication list is the same as yours.      Diastasis Recti  Diastasis recti is when the muscles of the abdomen (rectus abdominis muscles) become thin and separate. The result is a wider space between the right and left abdomen (abdominal) muscles. This wider space between the muscles may cause a bulge in the middle of your abdomen. You may notice this bulge when you are straining or when you sit up from a lying down position. Diastasis recti can affect men and women. It is most common among pregnant women, infants, people who are obese, and people who have had abdominal surgery. Exercise or surgical treatment may help correct it. What are the causes? Common causes of this condition include:  Pregnancy. The growing uterus puts pressure on the abdominal muscles, which causes the muscles to separate.  Obesity. Excess fat puts pressure on abdominal muscles.  Weightlifting.  Some abdomen exercises.  Advanced age.  Genetics.  Prior abdominal surgery. What increases the risk? This condition is more likely to develop in:  Women.  Newborns, especially newborns who are born early (prematurely). What are the signs or symptoms? Common symptoms of this condition include:  A bulge in the middle of the abdomen. You will notice it most when you sit up or strain.  Pain in the low back, pelvis, or hips.  Constipation.  Inability to control when you urinate (urinary incontinence).  Bloating.  Poor posture. How is this diagnosed? This condition is diagnosed with a physical exam. Your health care provider will ask you to lie flat on your back and do a crunch or half sit-up. If you have diastasis recti, a vertical bulge will appear between your abdominal muscles in the center of your abdomen. Your health  care provider will measure the gap between your muscles with one of the following:  A medical device used to measure the space between two objects (caliper).  A tape measure.  CT scan.  Ultrasound.  Finger spaces. Your health care provider will measure the space using their fingers. How is this treated? If your muscle separation is not too large, you may not need treatment. However, if you are a woman who plans to become pregnant again, you should treat this condition before your next pregnancy. Treatment may include:  Physical therapy to strengthen and tighten your abdominal muscles.  Lifestyle changes such as weight loss and exercise.  Over-the-counter pain medicines as needed.  Surgery to correct the separation. Follow these instructions at home: Activity  Return to your normal activities as told by your health care provider. Ask your health care provider what activities are safe for you.  When lifting weights or doing exercises using your abdominal muscles or the muscles in the center of your body that give stability (core muscles), make sure you are doing your exercises and movements correctly. Proper form can help to prevent the condition from happening again. General instructions  If you are overweight, ask your health care provider for help with weight loss. Losing even a small amount of weight can help to improve your diastasis recti.  Take over-the-counter or prescription medicines only as told by your health care provider.  Do not strain. Straining can make the separation worse. Examples of straining include: ? Pushing hard to have a bowel  movement, such as due to constipation. ? Lifting heavy objects, including children. ? Standing up and sitting down.  Take steps to prevent constipation: ? Drink enough fluid to keep your urine clear or pale yellow. ? Take over-the-counter or prescription medicines only as directed. ? Eat foods that are high in fiber, such as  fresh fruits and vegetables, whole grains, and beans. ? Limit foods that are high in fat and processed sugars, such as fried and sweet foods. Contact a health care provider if:  You notice a new bulge in your abdomen. Get help right away if:  You experience severe discomfort in your abdomen.  You develop severe abdominal pain along with nausea, vomiting, or fever. Summary  Diastasis recti is when the abdomen (abdominal) muscles become thin and separate. Your abdomen will stick out because the space between your right and left abdomen muscles has widened.  The most common symptom is a bulge in your abdomen. You will notice it most when you sit up or are straining.  This condition is diagnosed during a physical exam.  If the abdomen separation is not too big, you may choose not to have treatment. Otherwise, you may need to undergo physical therapy or surgery. This information is not intended to replace advice given to you by your health care provider. Make sure you discuss any questions you have with your health care provider. Document Released: 08/27/2016 Document Revised: 06/14/2017 Document Reviewed: 08/27/2016 Elsevier Patient Education  2020 [employer].

## 2019-08-14 ENCOUNTER — Encounter: Payer: Self-pay | Admitting: Family Medicine

## 2019-08-26 DIAGNOSIS — Z23 Encounter for immunization: Secondary | ICD-10-CM | POA: Diagnosis not present

## 2019-09-23 DIAGNOSIS — Z23 Encounter for immunization: Secondary | ICD-10-CM | POA: Diagnosis not present

## 2019-09-28 ENCOUNTER — Other Ambulatory Visit: Payer: Self-pay

## 2019-09-28 ENCOUNTER — Ambulatory Visit (INDEPENDENT_AMBULATORY_CARE_PROVIDER_SITE_OTHER): Payer: Medicare Other | Admitting: Family Medicine

## 2019-09-28 ENCOUNTER — Encounter: Payer: Self-pay | Admitting: Family Medicine

## 2019-09-28 VITALS — BP 153/89 | HR 77 | Temp 96.8°F | Ht 71.0 in | Wt 197.0 lb

## 2019-09-28 DIAGNOSIS — Z86010 Personal history of colonic polyps: Secondary | ICD-10-CM

## 2019-09-28 DIAGNOSIS — F419 Anxiety disorder, unspecified: Secondary | ICD-10-CM | POA: Diagnosis not present

## 2019-09-28 DIAGNOSIS — Z1211 Encounter for screening for malignant neoplasm of colon: Secondary | ICD-10-CM

## 2019-09-28 NOTE — Progress Notes (Signed)
Patient: Lawrence Ewing   DOB: 09-22-1945   74 y.o. Male  MRN: [id number] Visit Date: 09/28/2019  Today's Provider: Lelon Huh, MD  Subjective:    Chief Complaint  Patient presents with  . Hyperlipidemia  . Anxiety   Patient had a AWE with [facility] on 03/12/2019  HPI  Lipid/Cholesterol, Follow-up:   Last seen for this 8 months ago.  Management changes since that visit include no change. . Last Lipid Panel:    Component Value Date/Time   CHOL 182 01/20/2019 0815   TRIG 83 01/20/2019 0815   HDL 40 01/20/2019 0815   CHOLHDL 4.6 01/20/2019 0815   LDLCALC 125 (H) 01/20/2019 0815    He reports good compliance with treatment. He is not having side effects.  Current symptoms include none  Weight trend: stable Prior visit with dietician: no Current diet: in general, a "healthy" diet   Current exercise: walking  Wt Readings from Last 3 Encounters:  09/28/19 197 lb (89.4 kg)  07/07/19 200 lb (90.7 kg)  01/19/19 196 lb (88.9 kg)    -------------------------------------------------------------------  Follow up for Anxiety:  The patient was last seen for this 8 months ago. Changes made at last visit include no change.  He reports good compliance with treatment. He feels that condition is Improved. He is not having side effects.   ------------------------------------------------------------------------------------       Medications: Outpatient Medications Prior to Visit  Medication Sig Dispense Refill  . Cholecalciferol (VITAMIN D) 2000 UNITS CAPS Take 1 capsule by mouth daily.    . citalopram (CELEXA) 10 MG tablet TAKE 1 TABLET BY MOUTH DAILY 90 tablet 4  . citalopram (CELEXA) 20 MG tablet TAKE 1 TABLET BY MOUTH DAILY 90 tablet 4  . MULTIPLE VITAMINS-MINERALS PO Take 1 tablet by mouth daily.    Marland Kitchen omeprazole (PRILOSEC) 20 MG capsule Take 1 capsule by mouth daily.    . Probiotic Product (PROBIOTIC PO) Take by mouth daily.    . tamsulosin (FLOMAX) 0.4  MG CAPS capsule TAKE 1 CAPSULE AT BEDTIME 90 capsule 4  . vitamin E 400 UNIT capsule Take 400 Units by mouth daily.    Marland Kitchen topiramate (TOPAMAX) 50 MG tablet Take 1/2 tablet at bedtime for 6 days, then increase to 1 tablet daily. May increase to 2 tablets daily after 6 days if needed (Patient not taking: Reported on 07/07/2019) 30 tablet 3   No facility-administered medications prior to visit.    Review of Systems  Constitutional: Negative.   Respiratory: Negative.   Cardiovascular: Negative.   Musculoskeletal: Negative.          Objective:    BP (!) 153/89 (BP Location: Right Arm, Patient Position: Sitting, Cuff Size: Normal)   Pulse 77   Temp (!) 96.8 F (36 C) (Temporal)   Ht 5\' 11"  (1.803 m)   Wt 197 lb (89.4 kg)   BMI 27.48 kg/m    Physical Exam   General appearance:  Overweight male, cooperative and in no acute distress Head: Normocephalic, without obvious abnormality, atraumatic Respiratory: Respirations even and unlabored, normal respiratory rate Extremities: All extremities are intact.  Skin: Skin color, texture, turgor normal. No rashes seen  Psych: Appropriate mood and affect. Neurologic: Mental status: Alert, oriented to person, place, and time, thought content appropriate.  No results found for any visits on 09/28/19.    Assessment & Plan:    1. Colon cancer screening Last colon polyp excised 2010, last colonoscopy 2015 at [facility] -  Ambulatory referral to gastroenterology for colonoscopy  2. H/O adenomatous polyp of colon  - Ambulatory referral to gastroenterology for colonoscopy  3. Anxiety Doing well on current dose of citalopram to be continued unchanged.    The entirety of the information documented in the History of Present Illness, Review of Systems and Physical Exam were personally obtained by me. Portions of this information were initially documented by Idelle Jo, CMA and reviewed by me for thoroughness and accuracy.   Lelon Huh,  MD  [facility] [phone] (phone) [phone] (fax)  [place]

## 2019-09-28 NOTE — Patient Instructions (Signed)
.   Please review the attached list of medications and notify my office if there are any errors.   . Please bring all of your medications to every appointment so we can make sure that our medication list is the same as yours.   

## 2019-10-08 DIAGNOSIS — G43109 Migraine with aura, not intractable, without status migrainosus: Secondary | ICD-10-CM | POA: Diagnosis not present

## 2019-10-26 ENCOUNTER — Ambulatory Visit (INDEPENDENT_AMBULATORY_CARE_PROVIDER_SITE_OTHER): Payer: Medicare Other | Admitting: Adult Health

## 2019-10-26 ENCOUNTER — Ambulatory Visit
Admission: RE | Admit: 2019-10-26 | Discharge: 2019-10-26 | Disposition: A | Discharge status: Home / Self Care | Payer: Medicare Other | Source: Ambulatory Visit | Attending: Adult Health | Admitting: Adult Health

## 2019-10-26 ENCOUNTER — Telehealth: Payer: Self-pay

## 2019-10-26 ENCOUNTER — Encounter: Payer: Self-pay | Admitting: Adult Health

## 2019-10-26 ENCOUNTER — Ambulatory Visit: Payer: Self-pay | Admitting: *Deleted

## 2019-10-26 ENCOUNTER — Other Ambulatory Visit: Payer: Self-pay

## 2019-10-26 VITALS — BP 152/94 | HR 74 | Temp 96.6°F | Resp 98 | Wt 199.8 lb

## 2019-10-26 DIAGNOSIS — W19XXXA Unspecified fall, initial encounter: Secondary | ICD-10-CM

## 2019-10-26 DIAGNOSIS — M79641 Pain in right hand: Secondary | ICD-10-CM

## 2019-10-26 DIAGNOSIS — M25531 Pain in right wrist: Secondary | ICD-10-CM

## 2019-10-26 DIAGNOSIS — M7989 Other specified soft tissue disorders: Secondary | ICD-10-CM | POA: Diagnosis not present

## 2019-10-26 DIAGNOSIS — S60211A Contusion of right wrist, initial encounter: Secondary | ICD-10-CM | POA: Diagnosis not present

## 2019-10-26 DIAGNOSIS — S63501A Unspecified sprain of right wrist, initial encounter: Secondary | ICD-10-CM | POA: Diagnosis not present

## 2019-10-26 NOTE — Telephone Encounter (Signed)
Pt called in c/o right wrist pain and swelling after falling backwards yesterday in the yard.   He caught himself with his right hand.   He can move and use it but it's painful with some swelling too.  Denies numbness or tingling in right hand or fingers.   He is requesting to have it x-rayed.  COVID-19 screen questionnaire completed.   An in office appt was made for today at 11:00 with Laverna Peace, FNP.   He has had both of his vaccines for COVID.    I went over the care advice.   He is using ice already and taking Tylenol.  I sent my [facility] to [facility].  Reason for Disposition . Can't move injured arm normally (bend or straighten completely)  Answer Assessment - Initial Assessment Questions 1. MECHANISM: "How did the injury happen?"     Yesterday I was gardening and I walked backwards and my left foot hit a border and I tripped.   My balance went backwards and I used my right hand too catch myself then my butt.   My back is sore. My right hand is sore.   Yesterday it hurt a lot.   I can make a fist and move my fingers.   My wrist is sore and hard to move without a lot of pain.  It's swollen at my wrist.   2. ONSET: "When did the injury happen?" (Minutes or hours ago)      Administ[employer], [employer].   3. LOCATION: "Where is the injury located?"      Right wrist 4. APPEARANCE of INJURY: "What does the injury look like?"      Swollen 5. SEVERITY: "Can you use the arm normally?"      It hurts but I can lift a cup of coffee 6. SWELLING or BRUISING: "is there any swelling or bruising?" If so, ask: "How large is it? (e.g., inches, centimeters)      Yes 7. PAIN: "Is there pain?" If so, ask: "How bad is the pain?"    (Scale 1-10; or mild, moderate, severe)     8 on pain scale 8. TETANUS: For any breaks in the skin, ask: "When was the last tetanus booster?"     Not asked 9. OTHER SYMPTOMS: "Do you have any other symptoms?"  (e.g., numbness in hand)     Putting ice on it.  No numbness or  tingling. 10. PREGNANCY: "Is there any chance you are pregnant?" "When was your last menstrual period?"       N/A  Protocols used: ARM INJURY-A-AH

## 2019-10-26 NOTE — Patient Instructions (Signed)
Apply a compressive ACE bandage. Rest and elevate the affected painful area.  Apply cold compresses intermittently as needed.  As pain recedes, begin normal activities slowly as tolerated.  Call if symptoms persist.  [facility] [place], [place]  [phone] Open ? Closes 7:30PM Walk in 1pm to 7 pm.   Wrist Sprain, Adult A wrist sprain is a stretch or tear in the strong, fibrous tissues (ligaments) that connect your wrist bones. There are three types of wrist sprains:  Grade 1. In this type of sprain, the ligament is stretched more than normal.  Grade 2. In this type of sprain, the ligament is partially torn. You may be able to move your wrist, but not very much.  Grade 3. In this type of sprain, the ligament or muscle is completely torn. You may find it difficult or extremely painful to move your wrist even a little. What are the causes? A wrist sprain can be caused by using the wrist too much during sports, exercise, or at work. It can also happen with a fall or during an accident. What increases the risk? This condition is more likely to occur in people:  With a previous wrist or arm injury.  With poor wrist strength and flexibility.  Who play contact sports, such as football or soccer.  Who play sports that may result in a fall, such as skateboarding, biking, skiing, or snowboarding.  Who do not exercise regularly.  Who use exercise equipment that does not fit well. What are the signs or symptoms? Symptoms of this condition include:  Pain in the wrist, arm, or hand.  Swelling or bruised skin near the wrist, hand, or arm. The skin may look yellow or kind of blue.  Stiffness or trouble moving the hand.  Hearing a pop or feeling a tear at the time of the injury.  A warm feeling in the skin around the wrist. How is this diagnosed? This condition is diagnosed with a physical exam. Sometimes an X-ray is taken to make sure a bone did not break. If your  health care provider thinks that you tore a ligament, he or she may order an MRI of your wrist. How is this treated? This condition is treated by resting and applying ice to your wrist. Additional treatment may include:  Medicine for pain and inflammation.  A splint to keep your wrist still (immobilized).  Exercises to strengthen and stretch your wrist.  Surgery. This may be done if the ligament is completely torn. Follow these instructions at home: If you have a splint:   Do not put pressure on any part of the splint until it is fully hardened. This may take several hours.  Wear the splint as told by your health care provider. Remove it only as told by your health care provider.  Loosen the splint if your fingers tingle, become numb, or turn cold and blue.  If your splint is not waterproof: ? Do not let it get wet. ? Cover it with a watertight covering when you take a bath or a shower.  Keep the splint clean. Managing pain, stiffness, and swelling   If directed, put ice on the injured area. ? If you have a removable splint, remove it as told by your health care provider. ? Put ice in a plastic bag. ? Place a towel between your skin and the bag or between the splint and the bag. ? Leave the ice on for 20 minutes, 2-3 times per day.  Move your fingers often to avoid stiffness and to lessen swelling.  Raise (elevate) the injured area above the level of your heart while you are sitting or lying down. Activity  Rest your wrist. Do not do things that cause pain.  Return to your normal activities as told by your health care provider. Ask your health care provider what activities are safe for you.  Do exercises as told by your health care provider. General instructions  Take over-the-counter and prescription medicines only as told by your health care provider.  Do not use any products that contain nicotine or tobacco, such as cigarettes and e-cigarettes. These can delay  healing. If you need help quitting, ask your health care provider.  Ask your health care provider when it is safe to drive if you have a splint.  Keep all follow-up visits as told by your health care provider. This is important. Contact a health care provider if:  Your pain, bruising, or swelling gets worse.  Your skin becomes red, gets a rash, or has open sores.  Your pain does not get better or it gets worse. Get help right away if:  You have a new or sudden sharp pain in the hand, arm, or wrist.  You have tingling or numbness in your hand.  Your fingers turn white, very red, or cold and blue.  You cannot move your fingers. This information is not intended to replace advice given to you by your health care provider. Make sure you discuss any questions you have with your health care provider. Document Revised: 06/14/2017 Document Reviewed: 01/19/2016 Elsevier Patient Education  [place]. Wrist Pain, Adult There are many things that can cause wrist pain. Some common causes include:  An injury to the wrist area.  Overuse of the joint.  A condition that causes too much pressure to be put on a nerve in the wrist (carpal tunnel syndrome).  Wear and tear of the joints that happens as a person gets older (osteoarthritis).  Other types of arthritis. Sometimes, the cause of wrist pain is not known. Often, the pain goes away when you follow your doctor's instructions for helping pain at home, such as resting or icing your wrist. If your wrist pain does not go away, it is important to tell your doctor. Follow these instructions at home:  Rest the wrist area for 48 hours or more, or as long as told by your doctor.  If a splint or elastic bandage has been put on your wrist, use it as told by your doctor. ? Take off the splint or bandage only as told by your doctor. ? Loosen the splint or bandage if your fingers tingle, lose feeling (get numb), or turn cold or blue.  If  directed, apply ice to the injured area: ? If you have a removable splint or elastic bandage, remove it as told by your doctor. ? Put ice in a plastic bag. ? Place a towel between your skin and the bag or between your splint or bandage and the bag. ? Leave the ice on for 20 minutes, 2-3 times a day.   Keep your arm raised (elevated) above the level of your heart while you are sitting or lying down.  Take over-the-counter and prescription medicines only as told by your doctor.  Keep all follow-up visits as told by your doctor. This is important. Contact a doctor if:  You have a sudden sharp pain in the wrist, hand, or arm that is different  or new.  The swelling or bruising on your wrist or hand gets worse.  Your skin becomes red, gets a rash, or has open sores.  Your pain does not get better or it gets worse. Get help right away if:  You lose feeling in your fingers or hand.  Your fingers turn white, very red, or cold and blue.  You cannot move your fingers.  You have a fever or chills. This information is not intended to replace advice given to you by your health care provider. Make sure you discuss any questions you have with your health care provider. Document Revised: 06/14/2017 Document Reviewed: 01/19/2016 Elsevier Patient Education  [place] Pain Many things can cause hand pain. Some common causes are:  An injury.  Repeating the same movement with your hand over and over (overuse).  Osteoporosis.  Arthritis.  Lumps in the tendons or joints of the hand and wrist (ganglion cysts).  Nerve compression syndromes (carpal tunnel syndrome).  Inflammation of the tendons (tendinitis).  Infection. Follow these instructions at home: Pay attention to any changes in your symptoms. Take these actions to help with your discomfort: Managing pain, stiffness, and swelling   Take over-the-counter and prescription medicines only as told by your health care  provider.  Wear a hand splint or support as told by your health care provider.  If directed, put ice on the affected area: ? Put ice in a plastic bag. ? Place a towel between your skin and the bag. ? Leave the ice on for 20 minutes, 2-3 times a day. Activity  Take breaks from repetitive activity often.  Avoid activities that make your pain worse.  Minimize stress on your hands and wrists as much as possible.  Do stretches or exercises as told by your health care provider.  Do not do activities that make your pain worse. Contact a health care provider if:  Your pain does not get better after a few days of self-care.  Your pain gets worse.  Your pain affects your ability to do your daily activities. Get help right away if:  Your hand becomes warm, red, or swollen.  Your hand is numb or tingling.  Your hand is extremely swollen or deformed.  Your hand or fingers turn white or blue.  You cannot move your hand, wrist, or fingers. Summary  Many things can cause hand pain.  Contact your health care provider if your pain does not get better after a few days of self care.  Minimize stress on your hands and wrists as much as possible.  Do not do activities that make your pain worse. This information is not intended to replace advice given to you by your health care provider. Make sure you discuss any questions you have with your health care provider. Document Revised: 03/28/2018 Document Reviewed: 03/28/2018 Elsevier Patient Education  [place].

## 2019-10-26 NOTE — Progress Notes (Signed)
Established patient visit      Patient: Lawrence Ewing   DOB: 03-Jun-1946   74 y.o. Male  MRN: [id number] Visit Date: 10/26/2019  Today's healthcare provider: Marcille Buffy, FNP  Subjective:    Chief Complaint  Patient presents with  . Fall   Fall The accident occurred 12 to 24 hours ago. The fall occurred while walking (patient states that he fell on a piece of lumbar). He landed on grass. There was no blood loss. The point of impact was the buttocks and right wrist. The pain is present in the right wrist. The pain is moderate. The symptoms are aggravated by ambulation and pressure on injury. Pertinent negatives include no abdominal pain, bowel incontinence, fever, headaches, hearing loss, hematuria, loss of consciousness, nausea, numbness, tingling, visual change or vomiting. He has tried ice and acetaminophen for the symptoms. The treatment provided moderate relief.   He landed with all of his weight on the grace. He used right hand to protect himself. He did fall on outstretched hand catching his weight with his right hand he reports. Normal finger movement. Pain at rest. He is right handed.  Denies any recent illness.  Denies any head trauma.  Denies any head injury or trauma. No broken skin. He denied any previous injury, surgery or trauma to this extremity in past.  Denies any other pain or issues.  Denies any bruising/ bleeding.  Denies any palpitations.  Denies any other arm./ shoulder or back/ neck pain.  Patient  denies any fever ,chills, rash, chest pain, shortness of breath, nausea, vomiting, or diarrhea.    Patient Active Problem List   Diagnosis Date Noted  . Diastasis recti 07/07/2019  . Dysuria 03/25/2015  . Anxiety 12/16/2014  . Altered bowel function 12/16/2014  . BPH (benign prostatic hyperplasia) 12/16/2014  . 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 Blood pressure elevated 12/16/2014  . Abnormal prostate specific antigen 12/16/2014  . Acid  reflux 12/16/2014  . Gastrointestinal ulcer due to Helicobacter pylori 123456  . H/O adenomatous polyp of colon 12/16/2014  . HLD (hyperlipidemia) 12/16/2014  . IBS (irritable bowel syndrome) 12/16/2014  . Hemangioma of liver 12/16/2014  . Headache, migraine 12/16/2014  . OSA on CPAP 12/16/2014  . Awareness of heartbeats 12/16/2014  . Herpes zona 12/16/2014  . Vitamin D deficiency 12/16/2014   History reviewed. No pertinent past medical history. Past Surgical History:  Procedure Laterality Date  . EYE SURGERY  1976  . TONSILLECTOMY  1957       Medications: Outpatient Medications Prior to Visit  Medication Sig  . Cholecalciferol (VITAMIN D) 2000 UNITS CAPS Take 1 capsule by mouth daily.  . citalopram (CELEXA) 10 MG tablet TAKE 1 TABLET BY MOUTH DAILY  . citalopram (CELEXA) 20 MG tablet TAKE 1 TABLET BY MOUTH DAILY  . MULTIPLE VITAMINS-MINERALS PO Take 1 tablet by mouth daily.  Marland Kitchen omeprazole (PRILOSEC) 20 MG capsule Take 1 capsule by mouth daily.  . Probiotic Product (PROBIOTIC PO) Take by mouth daily.  . tamsulosin (FLOMAX) 0.4 MG CAPS capsule TAKE 1 CAPSULE AT BEDTIME  . vitamin E 400 UNIT capsule Take 400 Units by mouth daily.  Marland Kitchen topiramate (TOPAMAX) 50 MG tablet Take 1/2 tablet at bedtime for 6 days, then increase to 1 tablet daily. May increase to 2 tablets daily after 6 days if needed (Patient not taking: Reported on 07/07/2019)   No facility-administered medications prior to visit.    Review of Systems  Constitutional: Negative for fever.  Respiratory: Negative.   Cardiovascular: Negative.   Gastrointestinal: Negative.  Negative for abdominal distention, abdominal pain, anal bleeding, blood in stool, bowel incontinence, nausea and vomiting.  Genitourinary: Negative for hematuria.  Musculoskeletal: Positive for arthralgias (right hand / wrist. ) and joint swelling (right wrist radial side ). Negative for back pain, gait problem, myalgias, neck pain and neck stiffness.   Neurological: Negative for dizziness, tingling, tremors, seizures, loss of consciousness, syncope, facial asymmetry, speech difficulty, weakness, light-headedness, numbness and headaches.       Denies any pre syncope, dizziness or lightheadedness or syncope with fall or currently   Psychiatric/Behavioral: Negative for agitation, behavioral problems, confusion, decreased concentration and self-injury. The patient is not nervous/anxious.     Last CBC Lab Results  Component Value Date   WBC 7.1 07/22/2017   HGB 16.1 07/22/2017   HCT 47.7 07/22/2017   MCV 88 07/22/2017   MCH 29.5 07/22/2017   RDW 13.4 07/22/2017   PLT 325 [id number]   Last metabolic panel Lab Results  Component Value Date   GLUCOSE 106 (H) 01/20/2019   NA 141 01/20/2019   K 4.7 01/20/2019   CL 104 01/20/2019   CO2 22 01/20/2019   BUN 12 01/20/2019   CREATININE 0.93 01/20/2019   GFRNONAA 81 01/20/2019   GFRAA 94 01/20/2019   CALCIUM 9.2 01/20/2019   PROT 6.6 01/20/2019   ALBUMIN 4.0 01/20/2019   LABGLOB 2.6 01/20/2019   AGRATIO 1.5 01/20/2019   BILITOT 0.5 01/20/2019   ALKPHOS 72 01/20/2019   AST 19 01/20/2019   ALT 23 01/20/2019        Objective:    BP (!) 152/94   Pulse 74   Temp (!) 96.6 F (35.9 C) (Oral)   Resp (!) 98   Wt 199 lb 12.8 oz (90.6 kg)   HC 16" (40.6 cm)   BMI 27.87 kg/m    Physical Exam Vitals reviewed.  Constitutional:      General: He is not in acute distress.    Appearance: Normal appearance. He is not ill-appearing, toxic-appearing or diaphoretic.     Comments: Patient moves on and off of exam table and in room without difficulty. Gait is normal in hall and in room. Patient is oriented to person place time and situation. Patient answers questions appropriately and engages in conversation.   HENT:     Head: Normocephalic and atraumatic.     Right Ear: External ear normal.     Left Ear: External ear normal.  Cardiovascular:     Rate and Rhythm: Normal rate and regular  rhythm.     Pulses: Normal pulses.     Heart sounds: Normal heart sounds.  Abdominal:     General: There is no distension.     Palpations: Abdomen is soft.     Tenderness: There is no abdominal tenderness.  Musculoskeletal:        General: Swelling, tenderness and signs of injury present. No deformity.     Right upper arm: Normal.     Left upper arm: Normal.     Right elbow: Normal.     Left elbow: Normal.     Right forearm: Normal.     Left forearm: Normal.     Right wrist: Swelling (mild swelling radial side anterior wrist ), tenderness and bony tenderness (right radial / wrist ) present. No deformity, effusion, lacerations, snuff box tenderness or crepitus. Normal range of motion (pain with flexion of wrist. ). Normal pulse.  Left wrist: Normal. Normal pulse.     Right hand: Tenderness present. No swelling, deformity, lacerations or bony tenderness. Normal range of motion. Normal strength. Normal sensation. There is no disruption of two-point discrimination. Normal capillary refill. Normal pulse.     Left hand: Normal.     Cervical back: Normal range of motion and neck supple. No rigidity or tenderness.     Right lower leg: No edema.     Left lower leg: No edema.     Comments: No bruising to skin. No rash. No skeletal abnormality visualized or palpated. Brachial and radial pulse is 2+ bilaterally.   Skin:    General: Skin is warm.     Coloration: Skin is not pale.     Findings: No bruising, erythema, lesion or rash.  Neurological:     General: No focal deficit present.     Mental Status: He is alert and oriented to person, place, and time.     GCS: GCS eye subscore is 4. GCS verbal subscore is 5. GCS motor subscore is 6.     Cranial Nerves: Cranial nerves are intact. No cranial nerve deficit.     Sensory: Sensation is intact. No sensory deficit.     Motor: Motor function is intact. No weakness.     Coordination: Coordination is intact. Coordination normal.     Gait: Gait  normal.     Deep Tendon Reflexes: Reflexes normal.     Reflex Scores:      Bicep reflexes are 2+ on the right side and 2+ on the left side.    Comments: Sensation of right and left hand/ arm normal  And grip strength/ arm strength 5/5 bilaterally.   Psychiatric:        Mood and Affect: Mood normal.        Behavior: Behavior normal.        Thought Content: Thought content normal.        Judgment: Judgment normal.       No results found for any visits on 10/26/19.    Assessment & Plan:      Acute pain of right wrist - Plan: DG Hand Complete Right, DG Wrist Complete Right  Fall, initial encounter - Plan: DG Hand Complete Right, DG Wrist Complete Right   Orders Placed This Encounter  Procedures  . DG Hand Complete Right    Standing Status:   Future    Standing Expiration Date:   12/25/2020    Order Specific Question:   Reason for Exam (SYMPTOM  OR DIAGNOSIS REQUIRED)    Answer:   fall on outstretched hand - right- right wrist swelling. Fall/10/25/19    Order Specific Question:   Preferred imaging location?    Answer:   [facility]    Order Specific Question:   Radiology Contrast Protocol - do NOT remove file path    Answer:   \\[email or web address]  . DG Wrist Complete Right    Standing Status:   Future    Standing Expiration Date:   12/25/2020    Order Specific Question:   Reason for Exam (SYMPTOM  OR DIAGNOSIS REQUIRED)    Answer:   fall on right hand/ wrist pain/ swelling fall on 10/25/19    Order Specific Question:   Preferred imaging location?    Answer:   [facility]    Order Specific Question:   Radiology Contrast Protocol - do NOT remove file path    Answer:   \\[email or web address]   Apply a  compressive ACE bandage. Rest and elevate the affected painful area.  Apply cold compresses intermittently as needed.  As pain recedes, begin normal activities slowly as tolerated.  Call if  symptoms persist.  Will call with x ray results. Walk in x ray at [facility] outpatient imaging was placed, patient will go now.  NSAID discussed for pain per package instruction.  If fracture or persistent pain, results, will refer to [facility] and was given information on that office and walk in clinic Monday to Friday from 1:30 pm to 7 pm and address / phone was given.   An After Visit Summary was printed and given to the patient.- reviewed with patient.  Patient verbalized understanding of all instructions given and denies any further questions at this time.   Advised patient call the office or your primary care doctor for an appointment if no improvement within 72 hours or if any symptoms change or worsen at any time  Advised ER or urgent Care if after hours or on weekend. Call 911 for emergency symptoms at any time.Patinet verbalized understanding of all instructions given/reviewed and treatment plan and has no further questions or concerns at this time.     Keep regular health maintenance follow ups with Caryn Section, Kirstie Peri, MD    The entirety of the information documented in the History of Present Illness, Review of Systems and Physical Exam were personally obtained by me. Portions of this information were initially documented by the  Certified Medical Assistant whose name is documented in [place] and reviewed by me for thoroughness and accuracy.  I have personally performed the exam and reviewed the chart and it is accurate to the best of my knowledge.  Haematologist has been used and any errors in dictation or transcription are unintentional.  Kelby Aline. [facility]  Time spent 32 minutes total reviewing records/ reviewing history of injury/ medications and seeing patient. Will review imaging results ordered upon return.   Marcille Buffy, [place] [phone]  (phone) [phone] (fax)  [place]

## 2019-10-26 NOTE — Telephone Encounter (Signed)
Copied from [place] [id number]. Topic: General - Other >> Oct 26, 2019  4:41 PM Wynetta Emery, [place] C wrote: Reason for CRM: pt's spouse call in for imaging results. Please assist.   CB: [phone]

## 2019-10-27 ENCOUNTER — Other Ambulatory Visit: Payer: Self-pay | Admitting: Adult Health

## 2019-10-27 DIAGNOSIS — S52502D Unspecified fracture of the lower end of left radius, subsequent encounter for closed fracture with routine healing: Secondary | ICD-10-CM

## 2019-10-27 NOTE — Telephone Encounter (Signed)
See imaging result message, patient was notifed by phone on 10/26/19 by Laverna Peace and has plans to go to [facility] for further evaluation and treatment. KW

## 2019-11-10 DIAGNOSIS — S63501D Unspecified sprain of right wrist, subsequent encounter: Secondary | ICD-10-CM | POA: Diagnosis not present

## 2020-03-14 NOTE — Progress Notes (Signed)
Subjective:   Lawrence Ewing is a 74 y.o. male who presents for Medicare Annual/Subsequent preventive examination.  I connected with Odis Luster today by telephone and verified that I am speaking with the correct person using two identifiers. Location patient: home Location provider: work Persons participating in the virtual visit: patient, provider.   I discussed the limitations, risks, security and privacy concerns of performing an evaluation and management service by telephone and the availability of in person appointments. I also discussed with the patient that there may be a patient responsible charge related to this service. The patient expressed understanding and verbally consented to this telephonic visit.    Interactive audio and video telecommunications were attempted between this provider and patient, however failed, due to patient having technical difficulties OR patient did not have access to video capability.  We continued and completed visit with audio only.   Review of Systems    N/A  Cardiac Risk Factors include: advanced age (>87men, >32 women);male gender     Objective:    There were no vitals filed for this visit. There is no height or weight on file to calculate BMI.  Advanced Directives 03/15/2020 03/12/2019 03/12/2019 03/07/2018 12/16/2014  Does Patient Have a Medical Advance Directive? Yes Yes Yes Yes Yes  Type of Paramedic of [place];Living will [place];Living will [place];Living will [place];Living will [place];Living will  Copy of [place] in Chart? No - copy requested No - copy requested Yes - validated most recent copy scanned in chart (See row information) No - copy requested -    Current Medications (verified) Outpatient Encounter Medications as of 03/15/2020  Medication Sig  . Cholecalciferol (VITAMIN D) 2000 UNITS CAPS  Take 1 capsule by mouth daily.  . citalopram (CELEXA) 10 MG tablet TAKE 1 TABLET BY MOUTH DAILY  . citalopram (CELEXA) 20 MG tablet TAKE 1 TABLET BY MOUTH DAILY  . MULTIPLE VITAMINS-MINERALS PO Take 1 tablet by mouth daily.  Marland Kitchen omeprazole (PRILOSEC) 20 MG capsule Take 1 capsule by mouth daily.  . Probiotic Product (PROBIOTIC PO) Take by mouth daily.  . tamsulosin (FLOMAX) 0.4 MG CAPS capsule TAKE 1 CAPSULE AT BEDTIME  . vitamin E 400 UNIT capsule Take 400 Units by mouth daily.  Marland Kitchen topiramate (TOPAMAX) 50 MG tablet Take 1/2 tablet at bedtime for 6 days, then increase to 1 tablet daily. May increase to 2 tablets daily after 6 days if needed (Patient not taking: Reported on 07/07/2019)   No facility-administered encounter medications on file as of 03/15/2020.    Allergies (verified) Sumatriptan succinate, Temazepam, and Zolpidem   History: Past Medical History:  Diagnosis Date  . Hyperlipidemia    Past Surgical History:  Procedure Laterality Date  . EYE SURGERY  1976  . TONSILLECTOMY  1957   Family History  Problem Relation Age of Onset  . Hypertension Mother   . Arthritis Mother   . Stroke Father   . Prostate cancer Father   . Allergies Brother    Social History   Socioeconomic History  . Marital status: Married    Spouse name: Not on file  . Number of children: 3  . Years of education: Not on file  . Highest education level: Bachelor's degree (e.g., BA, AB, BS)  Occupational History  . 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 Smoking status: Former Smoker  Types: Cigarettes    Quit date: 20    Years since quitting: 52.6  . Smokeless tobacco: Never Used  . Tobacco comment: quit @ age 88  Vaping Use  . Vaping Use: Never used  Substance and Sexual Activity  . Alcohol use: Yes    Alcohol/week: 0.0 standard drinks    Comment: occasionally - 1-2 monthly  . Drug use: No  . Sexual activity: Not on file  Other Topics  Concern  . Not on file  Social History Narrative  . Not on file   Social Determinants of Health   Financial Resource Strain: Low Risk   . Difficulty of Paying Living Expenses: Not hard at all  Food Insecurity: No Food Insecurity  . Worried About Charity fundraiser in the Last Year: Never true  . Ran Out of Food in the Last Year: Never true  Transportation Needs: No Transportation Needs  . Lack of Transportation (Medical): No  . Lack of Transportation (Non-Medical): No  Physical Activity: Sufficiently Active  . Days of Exercise per Week: 7 days  . Minutes of Exercise per Session: 40 min  Stress: No Stress Concern Present  . Feeling of Stress : Not at all  Social Connections: Moderately Isolated  . Frequency of Communication with Friends and Family: More than three times a week  . Frequency of Social Gatherings with Friends and Family: More than three times a week  . Attends Religious Services: Never  . Active Member of Clubs or Organizations: No  . Attends Archivist Meetings: Never  . Marital Status: Married    Tobacco Counseling Counseling given: Not Answered Comment: quit @ age 32   Clinical Intake:  Pre-visit preparation completed: Yes  Pain : No/denies pain     Nutritional Risks: None Diabetes: No  How often do you need to have someone help you when you read instructions, pamphlets, or other written materials from your doctor or pharmacy?: 1 - Never  Diabetic? No  Interpreter Needed?: No  Information entered by :: [facility], LPN   Activities of Daily Living In your present state of health, do you have any difficulty performing the following activities: 03/15/2020  Hearing? N  Vision? N  Comment Wears eye glasses.  Difficulty concentrating or making decisions? N  Walking or climbing stairs? N  Dressing or bathing? N  Doing errands, shopping? N  Preparing Food and eating ? N  Using the Toilet? N  In the past six months, have you accidently  leaked urine? N  Do you have problems with loss of bowel control? N  Managing your Medications? N  Managing your Finances? N  Housekeeping or managing your Housekeeping? N  Some recent data might be hidden    Patient Care Team: Birdie Sons, MD as PCP - General (Family Medicine) Thomes Lolling, MD as Referring Physician (Urology) Jerilynn Som, MD as Consulting Physician (Psychiatry) Dorna Bloom, MD as Referring Physician (Gastroenterology) Jairo Ben, FNP (Neurology) Eulogio Bear, MD as Consulting Physician (Ophthalmology)  Indicate any recent Medical Services you may have received from other than [facility] providers in the past year (date may be approximate).     Assessment:   This is a routine wellness examination for Lawrence Ewing.  Hearing/Vision screen No exam data present  Dietary issues and exercise activities discussed: Current Exercise Habits: Home exercise routine, Type of exercise: walking, Time (Minutes): 40, Frequency (Times/Week): 7, Weekly Exercise (Minutes/Week): 280, Intensity: Mild, Exercise limited by: None identified  Goals    .  DIET - REDUCE SUGAR INTAKE     Recommend to cut back on sweets and desserts in daily diet to none.       Depression Screen PHQ 2/9 Scores 03/15/2020 03/12/2019 03/07/2018 03/27/2016 03/25/2015  PHQ - 2 Score 0 0 0 0 0    Fall Risk Fall Risk  03/15/2020 07/07/2019 03/12/2019 03/07/2018 03/27/2016  Falls in the past year? 0 0 0 No No  Number falls in past yr: 0 0 - - -  Injury with Fall? 0 0 - - -  Follow up - Falls evaluation completed - - -    Any stairs in or around the home? Yes  If so, are there any without handrails? No  Home free of loose throw rugs in walkways, pet beds, electrical cords, etc? Yes  Adequate lighting in your home to reduce risk of falls? Yes   ASSISTIVE DEVICES UTILIZED TO PREVENT FALLS:  Life alert? No  Use of a cane, walker or w/c? No  Grab bars in the bathroom? Yes  Shower chair or bench in  shower? No  Elevated toilet seat or a handicapped toilet? No    Cognitive Function: Declined today.        Immunizations Immunization History  Administered Date(s) Administered  . Influenza, High Dose Seasonal PF 05/26/2015, 03/27/2016, 03/25/2018, 04/28/2019  . Influenza-Unspecified 05/02/2017  . Moderna SARS-COVID-2 Vaccination 08/26/2019, 09/23/2019  . Pneumococcal Conjugate-13 03/25/2015  . Pneumococcal Polysaccharide-23 10/05/2011  . Tdap 02/24/2007  . Zoster Recombinat (Shingrix) 01/30/2018, 06/21/2018    TDAP status: Due, Education has been provided regarding the importance of this vaccine. Advised may receive this vaccine at local pharmacy or Health Dept. Aware to provide a copy of the vaccination record if obtained from local pharmacy or Health Dept. Verbalized acceptance and understanding. Flu Vaccine status: Due fall 2021 Pneumococcal vaccine status: Up to date Covid-19 vaccine status: Completed vaccines  Qualifies for Shingles Vaccine? Yes   Zostavax completed No   Shingrix Completed?: Yes  Screening Tests Health Maintenance  Topic Date Due  . TETANUS/TDAP  02/23/2017  . COLONOSCOPY  10/15/2018  . INFLUENZA VACCINE  02/14/2020  . COVID-19 Vaccine  Completed  . Hepatitis C Screening  Completed  . PNA vac Low Risk Adult  Completed    Health Maintenance  Health Maintenance Due  Topic Date Due  . TETANUS/TDAP  02/23/2017  . COLONOSCOPY  10/15/2018  . INFLUENZA VACCINE  02/14/2020    Colorectal cancer screening: Currently due. Pt has a f/u with Dr Loni Muse on 03/18/20 and will schedule a colonoscopy after.   Lung Cancer Screening: (Low Dose CT Chest recommended if Age 39-80 years, 30 pack-year currently smoking OR have quit w/in 15years.) does not qualify.   Additional Screening:  Hepatitis C Screening: Up to date  Vision Screening: Recommended annual ophthalmology exams for early detection of glaucoma and other disorders of the eye. Is the patient up to  date with their annual eye exam?  Yes  Who is the provider or what is the name of the office in which the patient attends annual eye exams? Dr Edison Pace @ [place] If pt is not established with a provider, would they like to be referred to a provider to establish care? No .   Dental Screening: Recommended annual dental exams for proper oral hygiene  Community Resource Referral / Chronic Care Management: CRR required this visit?  No   CCM required this visit?  No      Plan:     I  have personally reviewed and noted the following in the patient's chart:   . Medical and social history . Use of alcohol, tobacco or illicit drugs  . Current medications and supplements . Functional ability and status . Nutritional status . Physical activity . Advanced directives . List of other physicians . Hospitalizations, surgeries, and ER visits in previous 12 months . Vitals . Screenings to include cognitive, depression, and falls . Referrals and appointments  In addition, I have reviewed and discussed with patient certain preventive protocols, quality metrics, and best practice recommendations. A written personalized care plan for preventive services as well as general preventive health recommendations were provided to patient.     Hortensia Duffin [place], [place]   [id number]/97/[id number]   Nurse Notes: Pt to receive the flu and tetanus vaccine at pharmacy. Colonoscopy to be scheduled after GI apt on 03/18/20.

## 2020-03-15 ENCOUNTER — Other Ambulatory Visit: Payer: Self-pay

## 2020-03-15 ENCOUNTER — Ambulatory Visit (INDEPENDENT_AMBULATORY_CARE_PROVIDER_SITE_OTHER): Payer: Medicare Other

## 2020-03-15 DIAGNOSIS — Z0000 Encounter for general adult medical examination without abnormal findings: Secondary | ICD-10-CM

## 2020-03-15 NOTE — Patient Instructions (Signed)
[name] , Thank you for taking time to come for your Medicare Wellness Visit. I appreciate your ongoing commitment to your health goals. Please review the following plan we discussed and let me know if I can assist you in the future.   Screening recommendations/referrals: Colonoscopy: Currently due. Pt to follow up with GI this Friday to schedule colonoscopy. Recommended yearly ophthalmology/optometry visit for glaucoma screening and checkup Recommended yearly dental visit for hygiene and checkup  Vaccinations: Influenza vaccine: Due fall 2021 Pneumococcal vaccine: Completed series Tdap vaccine: Currently due, declined at this time. Shingles vaccine: Completed series    Advanced directives: Please bring a copy of your POA (Power of Attorney) and/or Living Will to your next appointment.   Conditions/risks identified: Recommend to cut back on sweets and desserts in daily diet to 1-2xs weekly.   Next appointment: 03/30/20 @ 8:40 AM with Dr Caryn Section   Preventive Care 74 Years and Older, Male Preventive care refers to lifestyle choices and visits with your health care provider that can promote health and wellness. What does preventive care include?  A yearly physical exam. This is also called an annual well check.  Dental exams once or twice a year.  Routine eye exams. Ask your health care provider how often you should have your eyes checked.  Personal lifestyle choices, including:  Daily care of your teeth and gums.  Regular physical activity.  Eating a healthy diet.  Avoiding tobacco and drug use.  Limiting alcohol use.  Practicing safe sex.  Taking low doses of aspirin every day.  Taking vitamin and mineral supplements as recommended by your health care provider. What happens during an annual well check? The services and screenings done by your health care provider during your annual well check will depend on your age, overall health, lifestyle risk factors, and family  history of disease. Counseling  Your health care provider may ask you questions about your:  Alcohol use.  Tobacco use.  Drug use.  Emotional well-being.  Home and relationship well-being.  Sexual activity.  Eating habits.  History of falls.  Memory and ability to understand (cognition).  Work and work Statistician. Screening  You may have the following tests or measurements:  Height, weight, and BMI.  Blood pressure.  Lipid and cholesterol levels. These may be checked every 5 years, or more frequently if you are over 74 years old.  Skin check.  Lung cancer screening. You may have this screening every year starting at age 27 if you have a 30-pack-year history of smoking and currently smoke or have quit within the past 15 years.  Fecal occult blood test (FOBT) of the stool. You may have this test every year starting at age 61.  Flexible sigmoidoscopy or colonoscopy. You may have a sigmoidoscopy every 5 years or a colonoscopy every 10 years starting at age 37.  Prostate cancer screening. Recommendations will vary depending on your family history and other risks.  Hepatitis C blood test.  Hepatitis B blood test.  Sexually transmitted disease (STD) testing.  Diabetes screening. This is done by checking your blood sugar (glucose) after you have not eaten for a while (fasting). You may have this done every 1-3 years.  Abdominal aortic aneurysm (AAA) screening. You may need this if you are a current or former smoker.  Osteoporosis. You may be screened starting at age 66 if you are at high risk. Talk with your health care provider about your test results, treatment options, and if necessary, the need  for more tests. Vaccines  Your health care provider may recommend certain vaccines, such as:  Influenza vaccine. This is recommended every year.  Tetanus, diphtheria, and acellular pertussis (Tdap, Td) vaccine. You may need a Td booster every 10 years.  Zoster vaccine.  You may need this after age 74.  Pneumococcal 13-valent conjugate (PCV13) vaccine. One dose is recommended after age 74.  Pneumococcal polysaccharide (PPSV23) vaccine. One dose is recommended after age 74. Talk to your health care provider about which screenings and vaccines you need and how often you need them. This information is not intended to replace advice given to you by your health care provider. Make sure you discuss any questions you have with your health care provider. Document Released: 07/29/2015 Document Revised: 03/21/2016 Document Reviewed: 05/03/2015 Elsevier Interactive Patient Education  2017 [place] Prevention in the Home Falls can cause injuries. They can happen to people of all ages. There are many things you can do to make your home safe and to help prevent falls. What can I do on the outside of my home?  Regularly fix the edges of walkways and driveways and fix any cracks.  Remove anything that might make you trip as you walk through a door, such as a raised step or threshold.  Trim any bushes or trees on the path to your home.  Use bright outdoor lighting.  Clear any walking paths of anything that might make someone trip, such as rocks or tools.  Regularly check to see if handrails are loose or broken. Make sure that both sides of any steps have handrails.  Any raised decks and porches should have guardrails on the edges.  Have any leaves, snow, or ice cleared regularly.  Use sand or salt on walking paths during winter.  Clean up any spills in your garage right away. This includes oil or grease spills. What can I do in the bathroom?  Use night lights.  Install grab bars by the toilet and in the tub and shower. Do not use towel bars as grab bars.  Use non-skid mats or decals in the tub or shower.  If you need to sit down in the shower, use a plastic, non-slip stool.  Keep the floor dry. Clean up any water that spills on the floor as soon  as it happens.  Remove soap buildup in the tub or shower regularly.  Attach bath mats securely with double-sided non-slip rug tape.  Do not have throw rugs and other things on the floor that can make you trip. What can I do in the bedroom?  Use night lights.  Make sure that you have a light by your bed that is easy to reach.  Do not use any sheets or blankets that are too big for your bed. They should not hang down onto the floor.  Have a firm chair that has side arms. You can use this for support while you get dressed.  Do not have throw rugs and other things on the floor that can make you trip. What can I do in the kitchen?  Clean up any spills right away.  Avoid walking on wet floors.  Keep items that you use a lot in easy-to-reach places.  If you need to reach something above you, use a strong step stool that has a grab bar.  Keep electrical cords out of the way.  Do not use floor polish or wax that makes floors slippery. If you must use wax, use  non-skid floor wax.  Do not have throw rugs and other things on the floor that can make you trip. What can I do with my stairs?  Do not leave any items on the stairs.  Make sure that there are handrails on both sides of the stairs and use them. Fix handrails that are broken or loose. Make sure that handrails are as long as the stairways.  Check any carpeting to make sure that it is firmly attached to the stairs. Fix any carpet that is loose or worn.  Avoid having throw rugs at the top or bottom of the stairs. If you do have throw rugs, attach them to the floor with carpet tape.  Make sure that you have a light switch at the top of the stairs and the bottom of the stairs. If you do not have them, ask someone to add them for you. What else can I do to help prevent falls?  Wear shoes that:  Do not have high heels.  Have rubber bottoms.  Are comfortable and fit you well.  Are closed at the toe. Do not wear sandals.  If  you use a stepladder:  Make sure that it is fully opened. Do not climb a closed stepladder.  Make sure that both sides of the stepladder are locked into place.  Ask someone to hold it for you, if possible.  Clearly mark and make sure that you can see:  Any grab bars or handrails.  First and last steps.  Where the edge of each step is.  Use tools that help you move around (mobility aids) if they are needed. These include:  Canes.  Walkers.  Scooters.  Crutches.  Turn on the lights when you go into a dark area. Replace any light bulbs as soon as they burn out.  Set up your furniture so you have a clear path. Avoid moving your furniture around.  If any of your floors are uneven, fix them.  If there are any pets around you, be aware of where they are.  Review your medicines with your doctor. Some medicines can make you feel dizzy. This can increase your chance of falling. Ask your doctor what other things that you can do to help prevent falls. This information is not intended to replace advice given to you by your health care provider. Make sure you discuss any questions you have with your health care provider. Document Released: 04/28/2009 Document Revised: 12/08/2015 Document Reviewed: 08/06/2014 Elsevier Interactive Patient Education  2017 [employer].

## 2020-03-18 DIAGNOSIS — Z86018 Personal history of other benign neoplasm: Secondary | ICD-10-CM | POA: Diagnosis not present

## 2020-03-18 DIAGNOSIS — Z8719 Personal history of other diseases of the digestive system: Secondary | ICD-10-CM | POA: Diagnosis not present

## 2020-03-18 DIAGNOSIS — R12 Heartburn: Secondary | ICD-10-CM | POA: Diagnosis not present

## 2020-03-24 DIAGNOSIS — G43109 Migraine with aura, not intractable, without status migrainosus: Secondary | ICD-10-CM | POA: Diagnosis not present

## 2020-03-29 NOTE — Progress Notes (Signed)
Established patient visit   Patient: Lawrence Ewing   DOB: 1945-11-24   74 y.o. Male  MRN: [id number] Visit Date: 03/30/2020  Today's healthcare provider: Lelon Huh, MD   Chief Complaint  Patient presents with  . Hyperlipidemia  . Anxiety   Subjective    HPI  Lipid/Cholesterol, Follow-up  Last lipid panel Other pertinent labs  Lab Results  Component Value Date   CHOL 182 01/20/2019   HDL 40 01/20/2019   LDLCALC 125 (H) 01/20/2019   TRIG 83 01/20/2019   CHOLHDL 4.6 01/20/2019   Lab Results  Component Value Date   ALT 23 01/20/2019   AST 19 01/20/2019   PLT 325 07/22/2017   TSH 4.470 03/27/2016     He was last seen for this 1 year ago.  Management since that visit includes continue same medication.  He reports good compliance with treatment. He is not having side effects.   Symptoms: No chest pain No chest pressure/discomfort  No dyspnea No lower extremity edema  No numbness or tingling of extremity No orthopnea  No palpitations No paroxysmal nocturnal dyspnea  No speech difficulty No syncope   Current diet: well balanced Current exercise: walking  The 10-year ASCVD risk score Mikey Bussing DC Jr., et al., 2013) is: 35.3%  ---------------------------------------------------------------------------------------------------  Anxiety, Follow-up  He was last seen for anxiety 6 months ago. Changes made at last visit include continue same medication.   He reports good compliance with treatment. He reports good tolerance of treatment. He is not having side effects.   He feels his anxiety is moderate and Unchanged since last visit.  Symptoms: No chest pain No difficulty concentrating  No dizziness No fatigue  No feelings of losing control No insomnia  No irritable No palpitations  No panic attacks No racing thoughts  No shortness of breath No sweating  No tremors/shakes    GAD-7 Results No flowsheet data found.  PHQ-9 Scores PHQ9 SCORE ONLY  03/15/2020 03/12/2019 03/07/2018  PHQ-9 Total Score 0 0 0    ---------------------------------------------------------------------------------------------------  He continues to follow up with his psychiatrist Dr. Carlis Abbott who is prescribing citalopram for anxiety. He feels medication is working very well and not having any trouble with his anxiety so long as he takes it every day. Denies and adverse effects from medication.   He does admit to poor diet the last several months, eating more salty food and sweets and has gained some weight, but he is walking for exercise every day.   He also reports he is scheduled for follow up colonoscopy at [facility] in a few months.      Medications: Outpatient Medications Prior to Visit  Medication Sig  . Cholecalciferol (VITAMIN D) 2000 UNITS CAPS Take 1 capsule by mouth daily.  . citalopram (CELEXA) 10 MG tablet TAKE 1 TABLET BY MOUTH DAILY  . citalopram (CELEXA) 20 MG tablet TAKE 1 TABLET BY MOUTH DAILY  . MULTIPLE VITAMINS-MINERALS PO Take 1 tablet by mouth daily.  Marland Kitchen omeprazole (PRILOSEC) 20 MG capsule Take 1 capsule by mouth daily.  . Probiotic Product (PROBIOTIC PO) Take by mouth daily.  . tamsulosin (FLOMAX) 0.4 MG CAPS capsule TAKE 1 CAPSULE AT BEDTIME  . vitamin E 400 UNIT capsule Take 400 Units by mouth daily.  Marland Kitchen topiramate (TOPAMAX) 50 MG tablet Take 1/2 tablet at bedtime for 6 days, then increase to 1 tablet daily. May increase to 2 tablets daily after 6 days if needed (Patient not taking: Reported on 03/30/2020)  No facility-administered medications prior to visit.    Review of Systems  Constitutional: Negative for appetite change, chills and fever.  Respiratory: Negative for chest tightness, shortness of breath and wheezing.   Cardiovascular: Negative for chest pain and palpitations.  Gastrointestinal: Negative for abdominal pain, nausea and vomiting.      Objective    BP (!) 165/90 (BP Location: Right Arm, Patient Position: Bed  low/side rails up, Cuff Size: Large)   Pulse 67   Temp 98.3 F (36.8 C) (Oral)   Resp 16   Wt 201 lb (91.2 kg)   BMI 28.03 kg/m    Physical Exam   General: Appearance:     Overweight male in no acute distress  Eyes:    PERRL, conjunctiva/corneas clear, EOM's intact       Lungs:     Clear to auscultation bilaterally, respirations unlabored  Heart:    Normal heart rate. Normal rhythm. No murmurs, rubs, or gallops.   MS:   All extremities are intact.   Neurologic:   Awake, alert, oriented x 3. No apparent focal neurological           defect.         Assessment & Plan     1. Elevated blood pressure reading He admits to poor diet and consuming to much salty foods. He is going to work on strict low salt diet and losing weight and will recheck BP in 3-4 months.  - TSH  2. Hyperlipidemia, unspecified hyperlipidemia type  - Comprehensive metabolic panel - Lipid panel - TSH  3. Anxiety Doing very well on citalopram managed by psychiatry. Continue current medications.    4. Need for influenza vaccination He anticipates getting flu shot at his pharmacy.      5. Prostate cancer screening  - PSA     Future Appointments  Date Time Provider [place]  08/03/2020  9:40 AM Birdie Sons, MD [facility]  03/22/2021  2:00 PM [facility]-NURSE HEALTH ADVISOR [facility]         The entirety of the information documented in the History of Present Illness, Review of Systems and Physical Exam were personally obtained by me. Portions of this information were initially documented by the CMA and reviewed by me for thoroughness and accuracy.      Lelon Huh, MD  [facility] [phone] (phone) [phone] (fax)  [place]

## 2020-03-30 ENCOUNTER — Other Ambulatory Visit: Payer: Self-pay

## 2020-03-30 ENCOUNTER — Ambulatory Visit (INDEPENDENT_AMBULATORY_CARE_PROVIDER_SITE_OTHER): Payer: Medicare Other | Admitting: Family Medicine

## 2020-03-30 ENCOUNTER — Encounter: Payer: Self-pay | Admitting: Family Medicine

## 2020-03-30 VITALS — BP 165/90 | HR 67 | Temp 98.3°F | Resp 16 | Wt 201.0 lb

## 2020-03-30 DIAGNOSIS — Z23 Encounter for immunization: Secondary | ICD-10-CM

## 2020-03-30 DIAGNOSIS — R030 Elevated blood-pressure reading, without diagnosis of hypertension: Secondary | ICD-10-CM

## 2020-03-30 DIAGNOSIS — F419 Anxiety disorder, unspecified: Secondary | ICD-10-CM | POA: Diagnosis not present

## 2020-03-30 DIAGNOSIS — E785 Hyperlipidemia, unspecified: Secondary | ICD-10-CM

## 2020-03-30 DIAGNOSIS — Z125 Encounter for screening for malignant neoplasm of prostate: Secondary | ICD-10-CM | POA: Diagnosis not present

## 2020-03-30 NOTE — Patient Instructions (Addendum)
Please review the attached list of medications and notify my office if there are any errors.    Please bring all of your medications to every appointment so we can make sure that our medication list is the same as yours.    I recommend that you get a flu vaccine this year. Please call our office at [phone] at your earliest convenience to schedule a flu shot.     You are due for a Td (tetanus-diptheria-vaccine) which protects you from tetanus and diptheria. Please check with your insurance plan or pharmacy regarding coverage for this vaccine.     DASH Eating Plan DASH stands for "Dietary Approaches to Stop Hypertension." The DASH eating plan is a healthy eating plan that has been shown to reduce high blood pressure (hypertension). It may also reduce your risk for type 2 diabetes, heart disease, and stroke. The DASH eating plan may also help with weight loss. What are tips for following this plan?  General guidelines  Avoid eating more than 2,300 mg (milligrams) of salt (sodium) a day. If you have hypertension, you may need to reduce your sodium intake to 1,500 mg a day.  Limit alcohol intake to no more than 1 drink a day for nonpregnant women and 2 drinks a day for men. One drink equals 12 oz of beer, 5 oz of wine, or 1 oz of hard liquor.  Work with your health care provider to maintain a healthy body weight or to lose weight. Ask what an ideal weight is for you.  Get at least 30 minutes of exercise that causes your heart to beat faster (aerobic exercise) most days of the week. Activities may include walking, swimming, or biking.  Work with your health care provider or diet and nutrition specialist (dietitian) to adjust your eating plan to your individual calorie needs. Reading food labels   Check food labels for the amount of sodium per serving. Choose foods with less than 5 percent of the Daily Value of sodium. Generally, foods with less than 300 mg of sodium per serving fit  into this eating plan.  To find whole grains, look for the word "whole" as the first word in the ingredient list. Shopping  Buy products labeled as "low-sodium" or "no salt added."  Buy fresh foods. Avoid canned foods and premade or frozen meals. Cooking  Avoid adding salt when cooking. Use salt-free seasonings or herbs instead of table salt or sea salt. Check with your health care provider or pharmacist before using salt substitutes.  Do not fry foods. Cook foods using healthy methods such as baking, boiling, grilling, and broiling instead.  Cook with heart-healthy oils, such as olive, canola, soybean, or sunflower oil. Meal planning  Eat a balanced diet that includes: ? 5 or more servings of fruits and vegetables each day. At each meal, try to fill half of your plate with fruits and vegetables. ? Up to 6-8 servings of whole grains each day. ? Less than 6 oz of lean meat, poultry, or fish each day. A 3-oz serving of meat is about the same size as a deck of cards. One egg equals 1 oz. ? 2 servings of low-fat dairy each day. ? A serving of nuts, seeds, or beans 5 times each week. ? Heart-healthy fats. Healthy fats called Omega-3 fatty acids are found in foods such as flaxseeds and coldwater fish, like sardines, salmon, and mackerel.  Limit how much you eat of the following: ? Canned or prepackaged foods. ?  Food that is high in trans fat, such as fried foods. ? Food that is high in saturated fat, such as fatty meat. ? Sweets, desserts, sugary drinks, and other foods with added sugar. ? Full-fat dairy products.  Do not salt foods before eating.  Try to eat at least 2 vegetarian meals each week.  Eat more home-cooked food and less restaurant, buffet, and fast food.  When eating at a restaurant, ask that your food be prepared with less salt or no salt, if possible. What foods are recommended? The items listed may not be a complete list. Talk with your dietitian about what dietary  choices are best for you. Grains Whole-grain or whole-wheat bread. Whole-grain or whole-wheat pasta. Brown rice. [place]. Bulgur. Whole-grain and low-sodium cereals. Pita bread. Low-fat, low-sodium crackers. Whole-wheat flour tortillas. Vegetables Fresh or frozen vegetables (raw, steamed, roasted, or grilled). Low-sodium or reduced-sodium tomato and vegetable juice. Low-sodium or reduced-sodium tomato sauce and tomato paste. Low-sodium or reduced-sodium canned vegetables. Fruits All fresh, dried, or frozen fruit. Canned fruit in natural juice (without added sugar). Meat and other protein foods Skinless chicken or [place]. Ground chicken or [place]. Pork with fat trimmed off. Fish and seafood. Egg whites. Dried beans, peas, or lentils. Unsalted nuts, nut butters, and seeds. Unsalted canned beans. Lean cuts of beef with fat trimmed off. Low-sodium, lean deli meat. Dairy Low-fat (1%) or fat-free (skim) milk. Fat-free, low-fat, or reduced-fat cheeses. Nonfat, low-sodium ricotta or cottage cheese. Low-fat or nonfat yogurt. Low-fat, low-sodium cheese. Fats and oils Soft margarine without trans fats. Vegetable oil. Low-fat, reduced-fat, or light mayonnaise and salad dressings (reduced-sodium). Canola, safflower, olive, soybean, and sunflower oils. Avocado. Seasoning and other foods Herbs. Spices. Seasoning mixes without salt. Unsalted popcorn and pretzels. Fat-free sweets. What foods are not recommended? The items listed may not be a complete list. Talk with your dietitian about what dietary choices are best for you. Grains Baked goods made with fat, such as croissants, muffins, or some breads. Dry pasta or rice meal packs. Vegetables Creamed or fried vegetables. Vegetables in a cheese sauce. Regular canned vegetables (not low-sodium or reduced-sodium). Regular canned tomato sauce and paste (not low-sodium or reduced-sodium). Regular tomato and vegetable juice (not low-sodium or reduced-sodium).  Angie Fava. Olives. Fruits Canned fruit in a light or heavy syrup. Fried fruit. Fruit in cream or butter sauce. Meat and other protein foods Fatty cuts of meat. Ribs. Fried meat. Berniece Salines. Sausage. Bologna and other processed lunch meats. Salami. Fatback. Hotdogs. Bratwurst. Salted nuts and seeds. Canned beans with added salt. Canned or smoked fish. Whole eggs or egg yolks. Chicken or [place] with skin. Dairy Whole or 2% milk, cream, and half-and-half. Whole or full-fat cream cheese. Whole-fat or sweetened yogurt. Full-fat cheese. Nondairy creamers. Whipped toppings. Processed cheese and cheese spreads. Fats and oils Butter. Stick margarine. Lard. Shortening. Ghee. Bacon fat. Tropical oils, such as coconut, palm kernel, or palm oil. Seasoning and other foods Salted popcorn and pretzels. Onion salt, garlic salt, seasoned salt, table salt, and sea salt. Worcestershire sauce. Tartar sauce. Barbecue sauce. Teriyaki sauce. Soy sauce, including reduced-sodium. Steak sauce. Canned and packaged gravies. Fish sauce. Oyster sauce. Cocktail sauce. Horseradish that you find on the shelf. Ketchup. Mustard. Meat flavorings and tenderizers. Bouillon cubes. Hot sauce and Tabasco sauce. Premade or packaged marinades. Premade or packaged taco seasonings. Relishes. Regular salad dressings. Where to find more information:  National Heart, Lung, and [place]: https://wilson-eaton.com/  American Heart Association: www.heart.org Summary  The DASH eating plan is  a healthy eating plan that has been shown to reduce high blood pressure (hypertension). It may also reduce your risk for type 2 diabetes, heart disease, and stroke.  With the DASH eating plan, you should limit salt (sodium) intake to 2,300 mg a day. If you have hypertension, you may need to reduce your sodium intake to 1,500 mg a day.  When on the DASH eating plan, aim to eat more fresh fruits and vegetables, whole grains, lean proteins, low-fat dairy, and  heart-healthy fats.  Work with your health care provider or diet and nutrition specialist (dietitian) to adjust your eating plan to your individual calorie needs. This information is not intended to replace advice given to you by your health care provider. Make sure you discuss any questions you have with your health care provider. Document Revised: 06/14/2017 Document Reviewed: 06/25/2016 Elsevier Patient Education  2020 [employer].

## 2020-03-31 DIAGNOSIS — E785 Hyperlipidemia, unspecified: Secondary | ICD-10-CM | POA: Diagnosis not present

## 2020-03-31 DIAGNOSIS — R030 Elevated blood-pressure reading, without diagnosis of hypertension: Secondary | ICD-10-CM | POA: Diagnosis not present

## 2020-03-31 DIAGNOSIS — Z125 Encounter for screening for malignant neoplasm of prostate: Secondary | ICD-10-CM | POA: Diagnosis not present

## 2020-04-01 LAB — COMPREHENSIVE METABOLIC PANEL
ALT: 36 IU/L (ref 0–44)
AST: 21 IU/L (ref 0–40)
Albumin/Globulin Ratio: 1.2 (ref 1.2–2.2)
Albumin: 4 g/dL (ref 3.7–4.7)
Alkaline Phosphatase: 80 IU/L (ref 44–121)
BUN/Creatinine Ratio: 16 (ref 10–24)
BUN: 14 mg/dL (ref 8–27)
Bilirubin Total: 0.6 mg/dL (ref 0.0–1.2)
CO2: 22 mmol/L (ref 20–29)
Calcium: 9.1 mg/dL (ref 8.6–10.2)
Chloride: 102 mmol/L (ref 96–106)
Creatinine, Ser: 0.86 mg/dL (ref 0.76–1.27)
GFR calc Af Amer: 99 mL/min/{1.73_m2} (ref >59)
GFR calc non Af Amer: 85 mL/min/{1.73_m2} (ref >59)
Globulin, Total: 3.3 g/dL (ref 1.5–4.5)
Glucose: 97 mg/dL (ref 65–99)
Potassium: 4.3 mmol/L (ref 3.5–5.2)
Sodium: 139 mmol/L (ref 134–144)
Total Protein: 7.3 g/dL (ref 6.0–8.5)

## 2020-04-01 LAB — LIPID PANEL
Chol/HDL Ratio: 4.9 ratio (ref 0.0–5.0)
Cholesterol, Total: 197 mg/dL (ref 100–199)
HDL: 40 mg/dL (ref >39)
LDL Chol Calc (NIH): 141 mg/dL — ABNORMAL HIGH (ref 0–99)
Triglycerides: 90 mg/dL (ref 0–149)
VLDL Cholesterol Cal: 16 mg/dL (ref 5–40)

## 2020-04-01 LAB — PSA: Prostate Specific Ag, Serum: 12 ng/mL — ABNORMAL HIGH (ref 0.0–4.0)

## 2020-04-01 LAB — TSH: TSH: 4.61 u[IU]/mL — ABNORMAL HIGH (ref 0.450–4.500)

## 2020-04-26 DIAGNOSIS — Z23 Encounter for immunization: Secondary | ICD-10-CM | POA: Diagnosis not present

## 2020-05-05 ENCOUNTER — Other Ambulatory Visit: Payer: Self-pay | Admitting: Family Medicine

## 2020-05-13 DIAGNOSIS — R972 Elevated prostate specific antigen [PSA]: Secondary | ICD-10-CM | POA: Diagnosis not present

## 2020-05-17 DIAGNOSIS — Z23 Encounter for immunization: Secondary | ICD-10-CM | POA: Diagnosis not present

## 2020-07-06 DIAGNOSIS — H90A21 Sensorineural hearing loss, unilateral, right ear, with restricted hearing on the contralateral side: Secondary | ICD-10-CM | POA: Diagnosis not present

## 2020-07-06 DIAGNOSIS — H6123 Impacted cerumen, bilateral: Secondary | ICD-10-CM | POA: Diagnosis not present

## 2020-07-06 DIAGNOSIS — H903 Sensorineural hearing loss, bilateral: Secondary | ICD-10-CM | POA: Diagnosis not present

## 2020-08-03 ENCOUNTER — Ambulatory Visit: Payer: Self-pay | Admitting: Family Medicine

## 2020-08-22 ENCOUNTER — Ambulatory Visit (INDEPENDENT_AMBULATORY_CARE_PROVIDER_SITE_OTHER): Payer: Medicare Other | Admitting: Family Medicine

## 2020-08-22 ENCOUNTER — Other Ambulatory Visit: Payer: Self-pay

## 2020-08-22 VITALS — BP 155/92 | HR 70 | Temp 97.2°F | Ht 71.0 in | Wt 203.0 lb

## 2020-08-22 DIAGNOSIS — I10 Essential (primary) hypertension: Secondary | ICD-10-CM | POA: Diagnosis not present

## 2020-08-22 MED ORDER — AMLODIPINE BESYLATE 2.5 MG PO TABS: 2.5000 mg | ORAL_TABLET | Freq: Every day | ORAL | 1 refills | Status: DC

## 2020-08-22 NOTE — Patient Instructions (Addendum)
DASH Eating Plan DASH stands for Dietary Approaches to Stop Hypertension. The DASH eating plan is a healthy eating plan that has been shown to:  Reduce high blood pressure (hypertension).  Reduce your risk for type 2 diabetes, heart disease, and stroke.  Help with weight loss. What are tips for following this plan? Reading food labels  Check food labels for the amount of salt (sodium) per serving. Choose foods with less than 5 percent of the Daily Value of sodium. Generally, foods with less than 300 milligrams (mg) of sodium per serving fit into this eating plan.  To find whole grains, look for the word "whole" as the first word in the ingredient list. Shopping  Buy products labeled as "low-sodium" or "no salt added."  Buy fresh foods. Avoid canned foods and pre-made or frozen meals. Cooking  Avoid adding salt when cooking. Use salt-free seasonings or herbs instead of table salt or sea salt. Check with your health care provider or pharmacist before using salt substitutes.  Do not fry foods. Cook foods using healthy methods such as baking, boiling, grilling, roasting, and broiling instead.  Cook with heart-healthy oils, such as olive, canola, avocado, soybean, or sunflower oil. Meal planning  Eat a balanced diet that includes: ? 4 or more servings of fruits and 4 or more servings of vegetables each day. Try to fill one-half of your plate with fruits and vegetables. ? 6-8 servings of whole grains each day. ? Less than 6 oz (170 g) of lean meat, poultry, or fish each day. A 3-oz (85-g) serving of meat is about the same size as a deck of cards. One egg equals 1 oz (28 g). ? 2-3 servings of low-fat dairy each day. One serving is 1 cup (237 mL). ? 1 serving of nuts, seeds, or beans 5 times each week. ? 2-3 servings of heart-healthy fats. Healthy fats called omega-3 fatty acids are found in foods such as walnuts, flaxseeds, fortified milks, and eggs. These fats are also found in  cold-water fish, such as sardines, salmon, and mackerel.  Limit how much you eat of: ? Canned or prepackaged foods. ? Food that is high in trans fat, such as some fried foods. ? Food that is high in saturated fat, such as fatty meat. ? Desserts and other sweets, sugary drinks, and other foods with added sugar. ? Full-fat dairy products.  Do not salt foods before eating.  Do not eat more than 4 egg yolks a week.  Try to eat at least 2 vegetarian meals a week.  Eat more home-cooked food and less restaurant, buffet, and fast food.   Lifestyle  When eating at a restaurant, ask that your food be prepared with less salt or no salt, if possible.  If you drink alcohol: ? Limit how much you use to:  0-1 drink a day for women who are not pregnant.  0-2 drinks a day for men. ? Be aware of how much alcohol is in your drink. In the [place] one drink equals one 12 oz bottle of beer (355 mL), one 5 oz glass of wine (148 mL), or one 1 oz glass of hard liquor (44 mL). General information  Avoid eating more than 2,300 mg of salt a day. If you have hypertension, you may need to reduce your sodium intake to 1,500 mg a day.  Work with your health care provider to maintain a healthy body weight or to lose weight. Ask what an ideal weight is for you.    Get at least 30 minutes of exercise that causes your heart to beat faster (aerobic exercise) most days of the week. Activities may include walking, swimming, or biking.  Work with your health care provider or dietitian to adjust your eating plan to your individual calorie needs. What foods should I eat? Fruits All fresh, dried, or frozen fruit. Canned fruit in natural juice (without added sugar). Vegetables Fresh or frozen vegetables (raw, steamed, roasted, or grilled). Low-sodium or reduced-sodium tomato and vegetable juice. Low-sodium or reduced-sodium tomato sauce and tomato paste. Low-sodium or reduced-sodium canned vegetables. Grains Whole-grain  or whole-wheat bread. Whole-grain or whole-wheat pasta. Brown rice. Oatmeal. Quinoa. Bulgur. Whole-grain and low-sodium cereals. Pita bread. Low-fat, low-sodium crackers. Whole-wheat flour tortillas. Meats and other proteins Skinless chicken or turkey. Ground chicken or turkey. Pork with fat trimmed off. Fish and seafood. Egg whites. Dried beans, peas, or lentils. Unsalted nuts, nut butters, and seeds. Unsalted canned beans. Lean cuts of beef with fat trimmed off. Low-sodium, lean precooked or cured meat, such as sausages or meat loaves. Dairy Low-fat (1%) or fat-free (skim) milk. Reduced-fat, low-fat, or fat-free cheeses. Nonfat, low-sodium ricotta or cottage cheese. Low-fat or nonfat yogurt. Low-fat, low-sodium cheese. Fats and oils Soft margarine without trans fats. Vegetable oil. Reduced-fat, low-fat, or light mayonnaise and salad dressings (reduced-sodium). Canola, safflower, olive, avocado, soybean, and sunflower oils. Avocado. Seasonings and condiments Herbs. Spices. Seasoning mixes without salt. Other foods Unsalted popcorn and pretzels. Fat-free sweets. The items listed above may not be a complete list of foods and beverages you can eat. Contact a dietitian for more information. What foods should I avoid? Fruits Canned fruit in a light or heavy syrup. Fried fruit. Fruit in cream or butter sauce. Vegetables Creamed or fried vegetables. Vegetables in a cheese sauce. Regular canned vegetables (not low-sodium or reduced-sodium). Regular canned tomato sauce and paste (not low-sodium or reduced-sodium). Regular tomato and vegetable juice (not low-sodium or reduced-sodium). Pickles. Olives. Grains Baked goods made with fat, such as croissants, muffins, or some breads. Dry pasta or rice meal packs. Meats and other proteins Fatty cuts of meat. Ribs. Fried meat. Bacon. Bologna, salami, and other precooked or cured meats, such as sausages or meat loaves. Fat from the back of a pig (fatback).  Bratwurst. Salted nuts and seeds. Canned beans with added salt. Canned or smoked fish. Whole eggs or egg yolks. Chicken or turkey with skin. Dairy Whole or 2% milk, cream, and half-and-half. Whole or full-fat cream cheese. Whole-fat or sweetened yogurt. Full-fat cheese. Nondairy creamers. Whipped toppings. Processed cheese and cheese spreads. Fats and oils Butter. Stick margarine. Lard. Shortening. Ghee. Bacon fat. Tropical oils, such as coconut, palm kernel, or palm oil. Seasonings and condiments Onion salt, garlic salt, seasoned salt, table salt, and sea salt. Worcestershire sauce. Tartar sauce. Barbecue sauce. Teriyaki sauce. Soy sauce, including reduced-sodium. Steak sauce. Canned and packaged gravies. Fish sauce. Oyster sauce. Cocktail sauce. Store-bought horseradish. Ketchup. Mustard. Meat flavorings and tenderizers. Bouillon cubes. Hot sauces. Pre-made or packaged marinades. Pre-made or packaged taco seasonings. Relishes. Regular salad dressings. Other foods Salted popcorn and pretzels. The items listed above may not be a complete list of foods and beverages you should avoid. Contact a dietitian for more information. Where to find more information  National Heart, Lung, and Blood Institute: www.nhlbi.nih.gov  American Heart Association: www.heart.org  Academy of Nutrition and Dietetics: www.eatright.org  National Kidney Foundation: www.kidney.org Summary  The DASH eating plan is a healthy eating plan that has been shown to reduce high   blood pressure (hypertension). It may also reduce your risk for type 2 diabetes, heart disease, and stroke.  When on the DASH eating plan, aim to eat more fresh fruits and vegetables, whole grains, lean proteins, low-fat dairy, and heart-healthy fats.  With the DASH eating plan, you should limit salt (sodium) intake to 2,300 mg a day. If you have hypertension, you may need to reduce your sodium intake to 1,500 mg a day.  Work with your health care  provider or dietitian to adjust your eating plan to your individual calorie needs. This information is not intended to replace advice given to you by your health care provider. Make sure you discuss any questions you have with your health care provider. Document Revised: 06/05/2019 Document Reviewed: 06/05/2019 Elsevier Patient Education  2021 Elsevier Inc.   

## 2020-08-22 NOTE — Progress Notes (Signed)
Established patient visit   Patient: Lawrence Ewing   DOB: Dec 02, 1945   75 y.o. Male  MRN: [id number] Visit Date: 08/22/2020  Today's healthcare provider: Lelon Huh, MD   No chief complaint on file.  Subjective    HPI    Pt would also like to talk about his throat. He has been having an itchy throat x 1 month and says it makes him cough. He believes it is due to allergies.  Hypertension, follow-up  BP Readings from Last 3 Encounters:  03/30/20 (!) 165/90  10/26/19 (!) 152/94  09/28/19 (!) 153/89   Wt Readings from Last 3 Encounters:  03/30/20 201 lb (91.2 kg)  10/26/19 199 lb 12.8 oz (90.6 kg)  09/28/19 197 lb (89.4 kg)     He was last seen for hypertension 4 months ago.  BP at that visit was 165/90. Management since that visit includes eating a low salt diet and losing weight.  He reports fair compliance with treatment. He is not having side effects.  He is following a Regular diet. He is not exercising. He does not smoke.  Use of agents associated with hypertension: none.   Outside blood pressures are N/A. Symptoms: No chest pain No chest pressure  No palpitations No syncope  No dyspnea No orthopnea  No paroxysmal nocturnal dyspnea No lower extremity edema   Pertinent labs: Lab Results  Component Value Date   CHOL 197 03/31/2020   HDL 40 03/31/2020   LDLCALC 141 (H) 03/31/2020   TRIG 90 03/31/2020   CHOLHDL 4.9 03/31/2020   Lab Results  Component Value Date   NA 139 03/31/2020   K 4.3 03/31/2020   CREATININE 0.86 03/31/2020   GFRNONAA 85 03/31/2020   GFRAA 99 03/31/2020   GLUCOSE 97 03/31/2020     The 10-year ASCVD risk score Mikey Bussing DC Jr., et al., 2013) is: 39.2%   ---------------------------------------------------------------------------------------------------      Medications: Outpatient Medications Prior to Visit  Medication Sig  . Cholecalciferol (VITAMIN D) 2000 UNITS CAPS Take 1 capsule by mouth Lawrence.  . citalopram  (CELEXA) 10 MG tablet TAKE 1 TABLET BY MOUTH Lawrence  . citalopram (CELEXA) 20 MG tablet TAKE 1 TABLET BY MOUTH Lawrence  . MULTIPLE VITAMINS-MINERALS PO Take 1 tablet by mouth Lawrence.  Marland Kitchen omeprazole (PRILOSEC) 20 MG capsule Take 1 capsule by mouth Lawrence.  . Probiotic Product (PROBIOTIC PO) Take by mouth Lawrence.  . tamsulosin (FLOMAX) 0.4 MG CAPS capsule TAKE 1 CAPSULE AT BEDTIME  . vitamin E 400 UNIT capsule Take 400 Units by mouth Lawrence.   No facility-administered medications prior to visit.    Review of Systems  HENT: Positive for postnasal drip.   Respiratory: Positive for cough.       Objective    BP (!) 155/92 (BP Location: Left Arm, Patient Position: Sitting, Cuff Size: Large)   Pulse 70   Temp (!) 97.2 F (36.2 C) (Temporal)   Ht 5\' 11"  (1.803 m)   Wt 203 lb (92.1 kg)   SpO2 100%   BMI 28.31 kg/m    Physical Exam    General: Appearance:     Overweight male in no acute distress  Eyes:    PERRL, conjunctiva/corneas clear, EOM's intact       Lungs:     Clear to auscultation bilaterally, respirations unlabored  Heart:    Normal heart rate. Normal rhythm. No murmurs, rubs, or gallops.   MS:   All extremities are intact.  Neurologic:   Awake, alert, oriented x 3. No apparent focal neurological           defect.         No results found for any visits on 08/22/20.  Assessment & Plan     1. Essential hypertension No progress with diet and exercise. He is very concerned about potential side effects. Will start with very low dose- amLODipine (NORVASC) 2.5 MG tablet; Take 1 tablet (2.5 mg total) by mouth Lawrence.  Dispense: 30 tablet; Refill: 1    Encourage to get back in exercise routine and given information on DASH diet.   Follow up to check BP in 6 weeks.      The entirety of the information documented in the History of Present Illness, Review of Systems and Physical Exam were personally obtained by me. Portions of this information were initially documented by the CMA and  reviewed by me for thoroughness and accuracy.      Lelon Huh, MD  [facility] [phone] (phone) [phone] (fax)  [place]

## 2020-09-17 ENCOUNTER — Other Ambulatory Visit: Payer: Self-pay | Admitting: Family Medicine

## 2020-09-17 DIAGNOSIS — I10 Essential (primary) hypertension: Secondary | ICD-10-CM

## 2020-10-04 ENCOUNTER — Ambulatory Visit (INDEPENDENT_AMBULATORY_CARE_PROVIDER_SITE_OTHER): Payer: Medicare Other | Admitting: Family Medicine

## 2020-10-04 ENCOUNTER — Other Ambulatory Visit: Payer: Self-pay

## 2020-10-04 DIAGNOSIS — I10 Essential (primary) hypertension: Secondary | ICD-10-CM | POA: Diagnosis not present

## 2020-10-04 MED ORDER — AMLODIPINE BESYLATE 2.5 MG PO TABS: 2.5000 mg | ORAL_TABLET | Freq: Every day | ORAL | 1 refills | Status: DC

## 2020-10-04 NOTE — Progress Notes (Signed)
Established patient visit   Patient: Lawrence Ewing   DOB: May 25, 1946   75 y.o. Male  MRN: [id number] Visit Date: 10/04/2020  Today's healthcare provider: Lelon Huh, MD   No chief complaint on file.  Subjective    HPI  Hypertension, follow-up  BP Readings from Last 3 Encounters:  08/22/20 (!) 155/92  03/30/20 (!) 165/90  10/26/19 (!) 152/94   Wt Readings from Last 3 Encounters:  08/22/20 203 lb (92.1 kg)  03/30/20 201 lb (91.2 kg)  10/26/19 199 lb 12.8 oz (90.6 kg)     He was last seen for hypertension 6 weeks ago.  BP at that visit was 155/92. Management since that visit includes starting amLODipine (NORVASC) 2.5 MG tablet daily. Encourage to get back in exercise routine and given information on DASH diet.   He reports excellent compliance with treatment. He is having side effects. Dizziness/light headedness when he gets up too quick. But he states that he was having heaviness in his head, which has resolved since he started the medication.  He is following a Regular diet. He is exercising. He does not smoke.  Use of agents associated with hypertension: none.   Outside blood pressures are n/a. Symptoms:  No chest pain No chest pressure  No palpitations No syncope  No dyspnea No orthopnea  No paroxysmal nocturnal dyspnea Yes lower extremity edema - slight swelling   Pertinent labs: Lab Results  Component Value Date   CHOL 197 03/31/2020   HDL 40 03/31/2020   LDLCALC 141 (H) 03/31/2020   TRIG 90 03/31/2020   CHOLHDL 4.9 03/31/2020   Lab Results  Component Value Date   NA 139 03/31/2020   K 4.3 03/31/2020   CREATININE 0.86 03/31/2020   GFRNONAA 85 03/31/2020   GFRAA 99 03/31/2020   GLUCOSE 97 03/31/2020     The 10-year ASCVD risk score Mikey Bussing DC Jr., et al., 2013) is: 37.8%   ---------------------------------------------------------------------------------------------------       Medications: Outpatient Medications Prior to Visit   Medication Sig  . amLODipine (NORVASC) 2.5 MG tablet Take 1 tablet (2.5 mg total) by mouth daily.  . Cholecalciferol (VITAMIN D) 2000 UNITS CAPS Take 1 capsule by mouth daily.  . citalopram (CELEXA) 10 MG tablet TAKE 1 TABLET BY MOUTH DAILY  . citalopram (CELEXA) 20 MG tablet TAKE 1 TABLET BY MOUTH DAILY  . MULTIPLE VITAMINS-MINERALS PO Take 1 tablet by mouth daily.  Marland Kitchen omeprazole (PRILOSEC) 20 MG capsule Take 1 capsule by mouth daily.  . Probiotic Product (PROBIOTIC PO) Take by mouth daily.  . tamsulosin (FLOMAX) 0.4 MG CAPS capsule TAKE 1 CAPSULE AT BEDTIME  . vitamin E 400 UNIT capsule Take 400 Units by mouth daily.   No facility-administered medications prior to visit.    Review of Systems     Objective    BP (!) 154/86 (BP Location: Left Arm, Patient Position: Sitting, Cuff Size: Normal)   Pulse 79   Ht 6' (1.829 m)   Wt 203 lb 9.6 oz (92.4 kg)   SpO2 98%   BMI 27.61 kg/m     Physical Exam   General appearance:  Overweight male, cooperative and in no acute distress Head: Normocephalic, without obvious abnormality, atraumatic Respiratory: Respirations even and unlabored, normal respiratory rate Extremities: All extremities are intact.  Skin: Skin color, texture, turgor normal. No rashes seen  Psych: Appropriate mood and affect. Neurologic: Mental status: Alert, oriented to person, place, and time, thought content appropriate.  Assessment & Plan     1. Essential hypertension Slightly improve on amlodipine, but having some dizziness when standing. He is going to work on cutting sodium consumption in diet and continue- amLODipine (NORVASC) 2.5 MG tablet; Take 1 tablet (2.5 mg total) by mouth daily.  Dispense: 90 tablet; Refill: 1   Follow up in 2-3 months.       The entirety of the information documented in the History of Present Illness, Review of Systems and Physical Exam were personally obtained by me. Portions of this information were initially documented by  the CMA and reviewed by me for thoroughness and accuracy.      Lelon Huh, MD  [facility] [phone] (phone) [phone] (fax)  [place]

## 2020-10-04 NOTE — Patient Instructions (Addendum)
. Please review the attached list of medications and notify my office if there are any errors.   . Please bring all of your medications to every appointment so we can make sure that our medication list is the same as yours.    [email or web address]">    DASH Eating Plan DASH stands for Dietary Approaches to Stop Hypertension. The DASH eating plan is a healthy eating plan that has been shown to:  Reduce high blood pressure (hypertension).  Reduce your risk for type 2 diabetes, heart disease, and stroke.  Help with weight loss. What are tips for following this plan? Reading food labels  Check food labels for the amount of salt (sodium) per serving. Choose foods with less than 5 percent of the Daily Value of sodium. Generally, foods with less than 300 milligrams (mg) of sodium per serving fit into this eating plan.  To find whole grains, look for the word "whole" as the first word in the ingredient list. Shopping  Buy products labeled as "low-sodium" or "no salt added."  Buy fresh foods. Avoid canned foods and pre-made or frozen meals. Cooking  Avoid adding salt when cooking. Use salt-free seasonings or herbs instead of table salt or sea salt. Check with your health care provider or pharmacist before using salt substitutes.  Do not fry foods. Cook foods using healthy methods such as baking, boiling, grilling, roasting, and broiling instead.  Cook with heart-healthy oils, such as olive, canola, avocado, soybean, or sunflower oil. Meal planning  Eat a balanced diet that includes: ? 4 or more servings of fruits and 4 or more servings of vegetables each day. Try to fill one-half of your plate with fruits and vegetables. ? 6-8 servings of whole grains each day. ? Less than 6 oz (170 g) of lean meat, poultry, or fish each day. A 3-oz (85-g) serving of meat is about the same size as a deck of cards. One egg equals 1 oz (28 g). ? 2-3 servings of  low-fat dairy each day. One serving is 1 cup (237 mL). ? 1 serving of nuts, seeds, or beans 5 times each week. ? 2-3 servings of heart-healthy fats. Healthy fats called omega-3 fatty acids are found in foods such as walnuts, flaxseeds, fortified milks, and eggs. These fats are also found in cold-water fish, such as sardines, salmon, and mackerel.  Limit how much you eat of: ? Canned or prepackaged foods. ? Food that is high in trans fat, such as some fried foods. ? Food that is high in saturated fat, such as fatty meat. ? Desserts and other sweets, sugary drinks, and other foods with added sugar. ? Full-fat dairy products.  Do not salt foods before eating.  Do not eat more than 4 egg yolks a week.  Try to eat at least 2 vegetarian meals a week.  Eat more home-cooked food and less restaurant, buffet, and fast food.   Lifestyle  When eating at a restaurant, ask that your food be prepared with less salt or no salt, if possible.  If you drink alcohol: ? Limit how much you use to:  0-1 drink a day for women who are not pregnant.  0-2 drinks a day for men. ? Be aware of how much alcohol is in your drink. In the [place] one drink equals one 12 oz bottle of beer (355 mL), one 5 oz glass of wine (148 mL), or one 1 oz glass of hard liquor (44 mL). General information  Avoid eating more  than 2,300 mg of salt a day. If you have hypertension, you may need to reduce your sodium intake to 1,500 mg a day.  Work with your health care provider to maintain a healthy body weight or to lose weight. Ask what an ideal weight is for you.  Get at least 30 minutes of exercise that causes your heart to beat faster (aerobic exercise) most days of the week. Activities may include walking, swimming, or biking.  Work with your health care provider or dietitian to adjust your eating plan to your individual calorie needs. What foods should I eat? Fruits All fresh, dried, or frozen fruit. Canned fruit in  natural juice (without added sugar). Vegetables Fresh or frozen vegetables (raw, steamed, roasted, or grilled). Low-sodium or reduced-sodium tomato and vegetable juice. Low-sodium or reduced-sodium tomato sauce and tomato paste. Low-sodium or reduced-sodium canned vegetables. Grains Whole-grain or whole-wheat bread. Whole-grain or whole-wheat pasta. Brown rice. [place]. Bulgur. Whole-grain and low-sodium cereals. Pita bread. Low-fat, low-sodium crackers. Whole-wheat flour tortillas. Meats and other proteins Skinless chicken or [place]. Ground chicken or [place]. Pork with fat trimmed off. Fish and seafood. Egg whites. Dried beans, peas, or lentils. Unsalted nuts, nut butters, and seeds. Unsalted canned beans. Lean cuts of beef with fat trimmed off. Low-sodium, lean precooked or cured meat, such as sausages or meat loaves. Dairy Low-fat (1%) or fat-free (skim) milk. Reduced-fat, low-fat, or fat-free cheeses. Nonfat, low-sodium ricotta or cottage cheese. Low-fat or nonfat yogurt. Low-fat, low-sodium cheese. Fats and oils Soft margarine without trans fats. Vegetable oil. Reduced-fat, low-fat, or light mayonnaise and salad dressings (reduced-sodium). Canola, safflower, olive, avocado, soybean, and sunflower oils. Avocado. Seasonings and condiments Herbs. Spices. Seasoning mixes without salt. Other foods Unsalted popcorn and pretzels. Fat-free sweets. The items listed above may not be a complete list of foods and beverages you can eat. Contact a dietitian for more information. What foods should I avoid? Fruits Canned fruit in a light or heavy syrup. Fried fruit. Fruit in cream or butter sauce. Vegetables Creamed or fried vegetables. Vegetables in a cheese sauce. Regular canned vegetables (not low-sodium or reduced-sodium). Regular canned tomato sauce and paste (not low-sodium or reduced-sodium). Regular tomato and vegetable juice (not low-sodium or reduced-sodium). Angie Fava.  Olives. Grains Baked goods made with fat, such as croissants, muffins, or some breads. Dry pasta or rice meal packs. Meats and other proteins Fatty cuts of meat. Ribs. Fried meat. Berniece Salines. Bologna, salami, and other precooked or cured meats, such as sausages or meat loaves. Fat from the back of a pig (fatback). Bratwurst. Salted nuts and seeds. Canned beans with added salt. Canned or smoked fish. Whole eggs or egg yolks. Chicken or [place] with skin. Dairy Whole or 2% milk, cream, and half-and-half. Whole or full-fat cream cheese. Whole-fat or sweetened yogurt. Full-fat cheese. Nondairy creamers. Whipped toppings. Processed cheese and cheese spreads. Fats and oils Butter. Stick margarine. Lard. Shortening. Ghee. Bacon fat. Tropical oils, such as coconut, palm kernel, or palm oil. Seasonings and condiments Onion salt, garlic salt, seasoned salt, table salt, and sea salt. Worcestershire sauce. Tartar sauce. Barbecue sauce. Teriyaki sauce. Soy sauce, including reduced-sodium. Steak sauce. Canned and packaged gravies. Fish sauce. Oyster sauce. Cocktail sauce. Store-bought horseradish. Ketchup. Mustard. Meat flavorings and tenderizers. Bouillon cubes. Hot sauces. Pre-made or packaged marinades. Pre-made or packaged taco seasonings. Relishes. Regular salad dressings. Other foods Salted popcorn and pretzels. The items listed above may not be a complete list of foods and beverages you should avoid. Contact a dietitian for  more information. Where to find more information  National Heart, Lung, and Blood Institute: https://wilson-eaton.com/  American Heart Association: www.heart.org  Academy of Nutrition and Dietetics: www.eatright.[place]: www.kidney.org Summary  The DASH eating plan is a healthy eating plan that has been shown to reduce high blood pressure (hypertension). It may also reduce your risk for type 2 diabetes, heart disease, and stroke.  When on the DASH eating plan, aim to  eat more fresh fruits and vegetables, whole grains, lean proteins, low-fat dairy, and heart-healthy fats.  With the DASH eating plan, you should limit salt (sodium) intake to 2,300 mg a day. If you have hypertension, you may need to reduce your sodium intake to 1,500 mg a day.  Work with your health care provider or dietitian to adjust your eating plan to your individual calorie needs. This information is not intended to replace advice given to you by your health care provider. Make sure you discuss any questions you have with your health care provider. Document Revised: 06/05/2019 Document Reviewed: 06/05/2019 Elsevier Patient Education  2021 [employer].

## 2020-11-24 DIAGNOSIS — K648 Other hemorrhoids: Secondary | ICD-10-CM | POA: Diagnosis not present

## 2020-11-24 DIAGNOSIS — Z86010 Personal history of colonic polyps: Secondary | ICD-10-CM | POA: Diagnosis not present

## 2020-11-24 DIAGNOSIS — K5730 Diverticulosis of large intestine without perforation or abscess without bleeding: Secondary | ICD-10-CM | POA: Diagnosis not present

## 2020-11-24 DIAGNOSIS — Z1211 Encounter for screening for malignant neoplasm of colon: Secondary | ICD-10-CM | POA: Diagnosis not present

## 2020-11-24 LAB — HM COLONOSCOPY

## 2020-12-13 ENCOUNTER — Ambulatory Visit: Payer: Self-pay | Admitting: *Deleted

## 2020-12-13 ENCOUNTER — Encounter: Payer: Self-pay | Admitting: Family Medicine

## 2020-12-13 NOTE — Telephone Encounter (Signed)
Pt stated bitten by a tick 2 weeks ago. Located right hip, engorged. Pt noted a red rash develop the first week. Described rash as 4 inch horizontal red rash. Pt stated rash is almost gone now except for "8 red dots" around a 2 inch area. Pt denies fever or joint pain. Pt will attempt to upload pictures of affected area and tick. Care advice given to pt and pt verbalized understanding.             Reason for Disposition . Wood tick bite with no complications  Answer Assessment - Initial Assessment Questions 1. TYPE of TICK: "Is it a wood tick or a deer tick?" (e.g., deer tick, wood tick; unsure)     unsure 2. SIZE of TICK: "How big is the tick?" (e.g., size of poppy seed, apple seed, watermelon seed; unsure) Note: Deer ticks can be the size of a poppy seed (nymph) or an apple seed (adult).       Sesame seed 3. ENGORGED: "Did the tick look flat or engorged (full, swollen)?" (e.g., flat, engorged; unsure)     engorged 4. LOCATION: "Where is the tick bite located?"      Right hip 5. ONSET: "How long do you think the tick was attached before you removed it?" (e.g., 5 hours, 2 days)      unsure 6. APPEARANCE of BITE or RASH: "What does the site look like?"     Initially rash was 4" horizontal rash Now: rash is almost gone Rosy red dots that  look like 8 bites around 2 inch area  Protocols used: TICK BITE-A-AH

## 2021-01-11 DIAGNOSIS — H539 Unspecified visual disturbance: Secondary | ICD-10-CM | POA: Diagnosis not present

## 2021-01-11 DIAGNOSIS — G43109 Migraine with aura, not intractable, without status migrainosus: Secondary | ICD-10-CM | POA: Diagnosis not present

## 2021-01-19 ENCOUNTER — Other Ambulatory Visit: Payer: Self-pay | Admitting: Family Medicine

## 2021-01-19 DIAGNOSIS — I10 Essential (primary) hypertension: Secondary | ICD-10-CM

## 2021-01-23 ENCOUNTER — Encounter: Payer: Self-pay | Admitting: Family Medicine

## 2021-01-23 ENCOUNTER — Ambulatory Visit (INDEPENDENT_AMBULATORY_CARE_PROVIDER_SITE_OTHER): Payer: Medicare Other | Admitting: Family Medicine

## 2021-01-23 ENCOUNTER — Other Ambulatory Visit: Payer: Self-pay

## 2021-01-23 VITALS — BP 136/64 | HR 93 | Temp 98.1°F | Resp 16 | Wt 200.0 lb

## 2021-01-23 DIAGNOSIS — I10 Essential (primary) hypertension: Secondary | ICD-10-CM

## 2021-01-23 DIAGNOSIS — R197 Diarrhea, unspecified: Secondary | ICD-10-CM | POA: Diagnosis not present

## 2021-01-23 NOTE — Progress Notes (Signed)
Established patient visit   Patient: Lawrence Ewing   DOB: 1945-10-27   75 y.o. Male  MRN: [id number] Visit Date: 01/23/2021  Today's healthcare provider: Lelon Huh, MD   Chief Complaint  Patient presents with   Hypertension   Diarrhea   Subjective    HPI  Hypertension, follow-up  BP Readings from Last 3 Encounters:  01/23/21 (!) 147/81  10/04/20 (!) 154/86  08/22/20 (!) 155/92   Wt Readings from Last 3 Encounters:  01/23/21 200 lb (90.7 kg)  10/04/20 203 lb 9.6 oz (92.4 kg)  08/22/20 203 lb (92.1 kg)     He was last seen for hypertension 3 months ago.  BP at that visit was 154/86. Management since that visit includes advising patient to work on cutting sodium consumption in diet and continue- amLODipine (NORVASC) 2.5mg  daily.  He reports good compliance with treatment. He is not having side effects.  He is following a Regular diet. He is exercising. He does not smoke.  Use of agents associated with hypertension: none.   Outside blood pressures are averaging 141/84. Symptoms: No chest pain No chest pressure  No palpitations No syncope  No dyspnea No orthopnea  No paroxysmal nocturnal dyspnea No lower extremity edema   Pertinent labs: Lab Results  Component Value Date   CHOL 197 03/31/2020   HDL 40 03/31/2020   LDLCALC 141 (H) 03/31/2020   TRIG 90 03/31/2020   CHOLHDL 4.9 03/31/2020   Lab Results  Component Value Date   NA 139 03/31/2020   K 4.3 03/31/2020   CREATININE 0.86 03/31/2020   GFRNONAA 85 03/31/2020   GFRAA 99 03/31/2020   GLUCOSE 97 03/31/2020     The 10-year ASCVD risk score Mikey Bussing DC Jr., et al., 2013) is: 36.9%   ---------------------------------------------------------------------------------------------------   Diarrhea: Patient complains of diarrhea for the past 2 days. He initially had some abdominal pain, but that has resolved. Patient denies any fever or vomiting. Since onset of diarrhea patient has been feeling tired  and weak.        Medications: Outpatient Medications Prior to Visit  Medication Sig   amLODipine (NORVASC) 2.5 MG tablet TAKE ONE TABLET BY MOUTH EVERY DAY   Cholecalciferol (VITAMIN D) 2000 UNITS CAPS Take 1 capsule by mouth daily.   citalopram (CELEXA) 10 MG tablet TAKE 1 TABLET BY MOUTH DAILY   citalopram (CELEXA) 20 MG tablet TAKE 1 TABLET BY MOUTH DAILY   MULTIPLE VITAMINS-MINERALS PO Take 1 tablet by mouth daily.   omeprazole (PRILOSEC) 20 MG capsule Take 1 capsule by mouth daily.   Probiotic Product (PROBIOTIC PO) Take by mouth daily.   tamsulosin (FLOMAX) 0.4 MG CAPS capsule TAKE 1 CAPSULE AT BEDTIME   vitamin E 400 UNIT capsule Take 400 Units by mouth daily.   No facility-administered medications prior to visit.    Review of Systems  Constitutional:  Negative for appetite change, chills and fever.  Respiratory:  Negative for chest tightness, shortness of breath and wheezing.   Cardiovascular:  Negative for chest pain and palpitations.  Gastrointestinal:  Positive for diarrhea. Negative for abdominal pain, nausea and vomiting.      Objective    BP 136/64   Pulse 93   Temp 98.1 F (36.7 C) (Temporal)   Resp 16   Wt 200 lb (90.7 kg)   BMI 27.12 kg/m     Physical Exam  General appearance:  Overweight male, cooperative and in no acute distress Head: Normocephalic, without obvious  abnormality, atraumatic Respiratory: Respirations even and unlabored, normal respiratory rate Extremities: All extremities are intact.  Skin: Skin color, texture, turgor normal. No rashes seen  Psych: Appropriate mood and affect. Neurologic: Mental status: Alert, oriented to person, place, and time, thought content appropriate.     Assessment & Plan     1. Diarrhea of presumed infectious origin Currently on day 3. Is to consume bland diet. If not much better in 2 more days he is to collect stool sample and take to lab.  - GI Profile, Stool, PCR  2. Primary  hypertension Tolerating initiation of amlodipine with improved BP. Continue 2.5mg  for now. Consider increasing to 5 if not continuing to improve at follow up/CPE in 3-4 months.         The entirety of the information documented in the History of Present Illness, Review of Systems and Physical Exam were personally obtained by me. Portions of this information were initially documented by the CMA and reviewed by me for thoroughness and accuracy.     Lelon Huh, MD  [facility] [phone] (phone) [phone] (fax)  [place]

## 2021-01-23 NOTE — Patient Instructions (Signed)
Please review the attached list of medications and notify my office if there are any errors.   If you are not feeling better by Wednesday, then collect the stool sample and return to the [place] [facility]

## 2021-01-24 DIAGNOSIS — H539 Unspecified visual disturbance: Secondary | ICD-10-CM | POA: Diagnosis not present

## 2021-01-24 DIAGNOSIS — G43109 Migraine with aura, not intractable, without status migrainosus: Secondary | ICD-10-CM | POA: Diagnosis not present

## 2021-01-26 DIAGNOSIS — R197 Diarrhea, unspecified: Secondary | ICD-10-CM | POA: Diagnosis not present

## 2021-01-27 DIAGNOSIS — R972 Elevated prostate specific antigen [PSA]: Secondary | ICD-10-CM | POA: Diagnosis not present

## 2021-01-27 DIAGNOSIS — R310 Gross hematuria: Secondary | ICD-10-CM | POA: Diagnosis not present

## 2021-01-30 LAB — GI PROFILE, STOOL, PCR

## 2021-02-01 DIAGNOSIS — R9341 Abnormal radiologic findings on diagnostic imaging of renal pelvis, ureter, or bladder: Secondary | ICD-10-CM | POA: Diagnosis not present

## 2021-02-01 DIAGNOSIS — R310 Gross hematuria: Secondary | ICD-10-CM | POA: Diagnosis not present

## 2021-02-11 DIAGNOSIS — C61 Malignant neoplasm of prostate: Secondary | ICD-10-CM | POA: Diagnosis not present

## 2021-02-11 DIAGNOSIS — R972 Elevated prostate specific antigen [PSA]: Secondary | ICD-10-CM | POA: Diagnosis not present

## 2021-03-03 DIAGNOSIS — R932 Abnormal findings on diagnostic imaging of liver and biliary tract: Secondary | ICD-10-CM | POA: Diagnosis not present

## 2021-03-03 DIAGNOSIS — R972 Elevated prostate specific antigen [PSA]: Secondary | ICD-10-CM | POA: Diagnosis not present

## 2021-03-09 IMAGING — CR DG HAND COMPLETE 3+V*R*
1 series · 3 of 3 positions shown · non-contrast
Comparison: None.

CLINICAL DATA: Fell on outstretched hand yesterday. Right wrist
pain and swelling with bruising along the ulnar side.

EXAM:
RIGHT HAND - COMPLETE 3+ VIEW

[Series 1: dg hand complete right · 0.14mm/px · 3 of 3 slices shown]
[im 1/3]
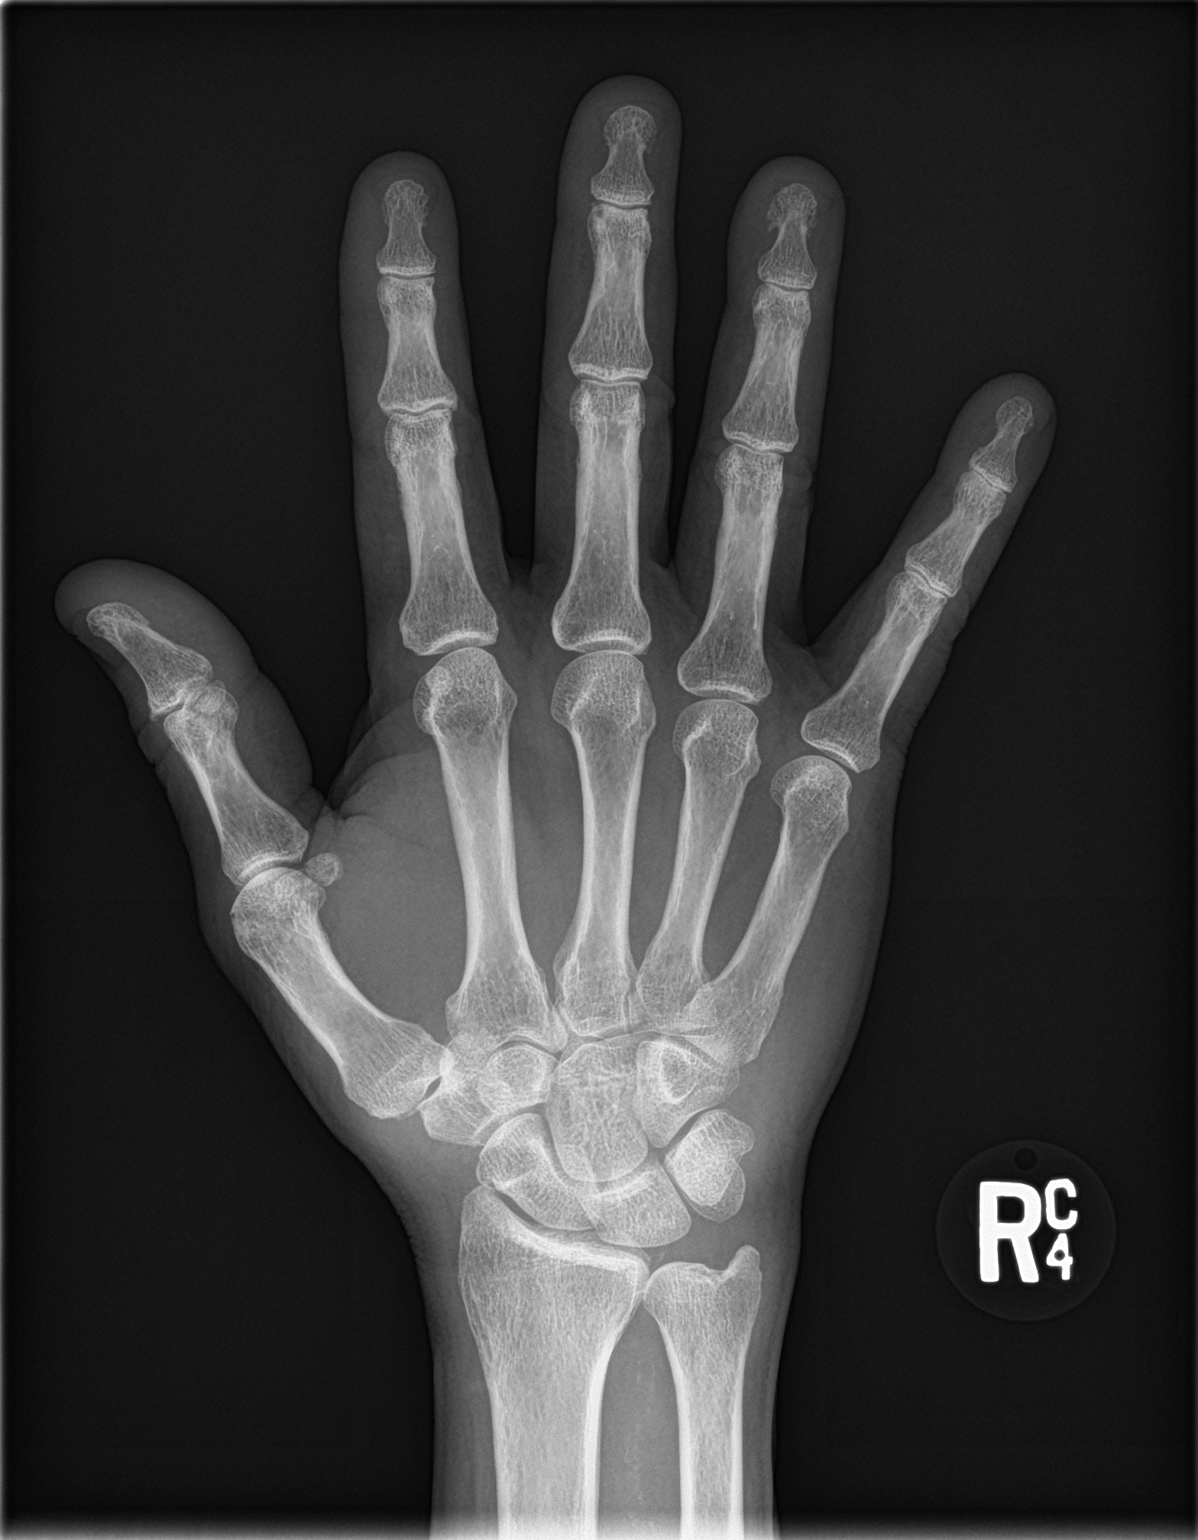
[im 2/3]
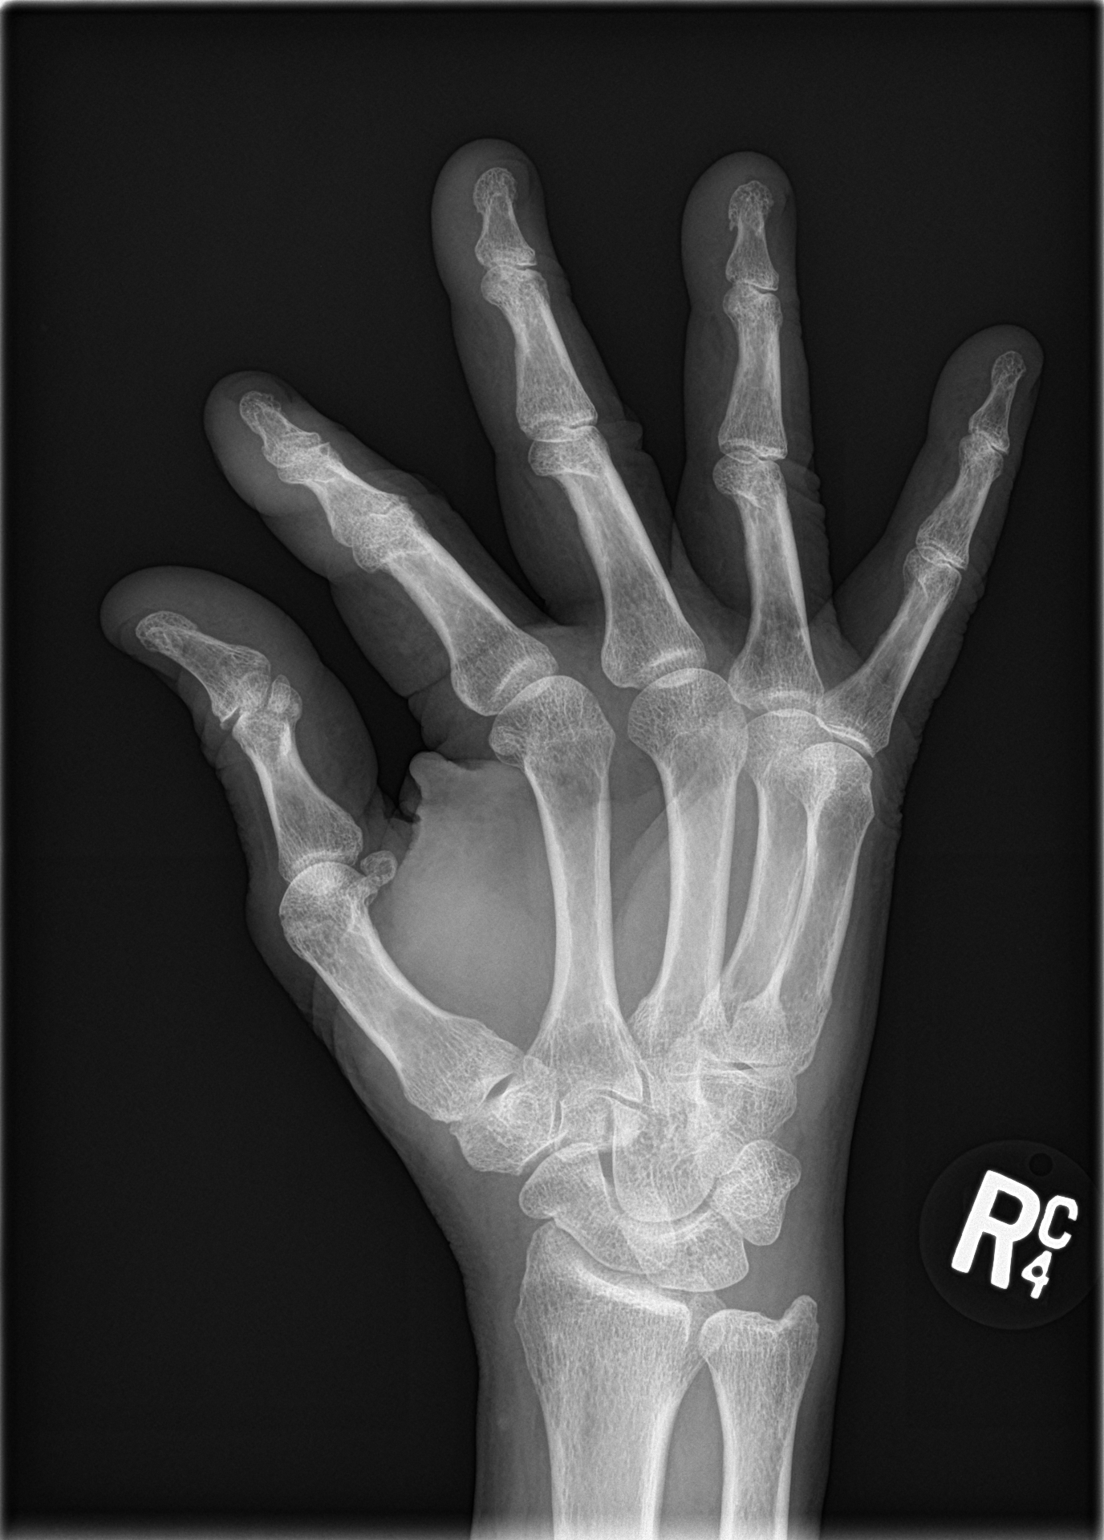
[im 3/3]
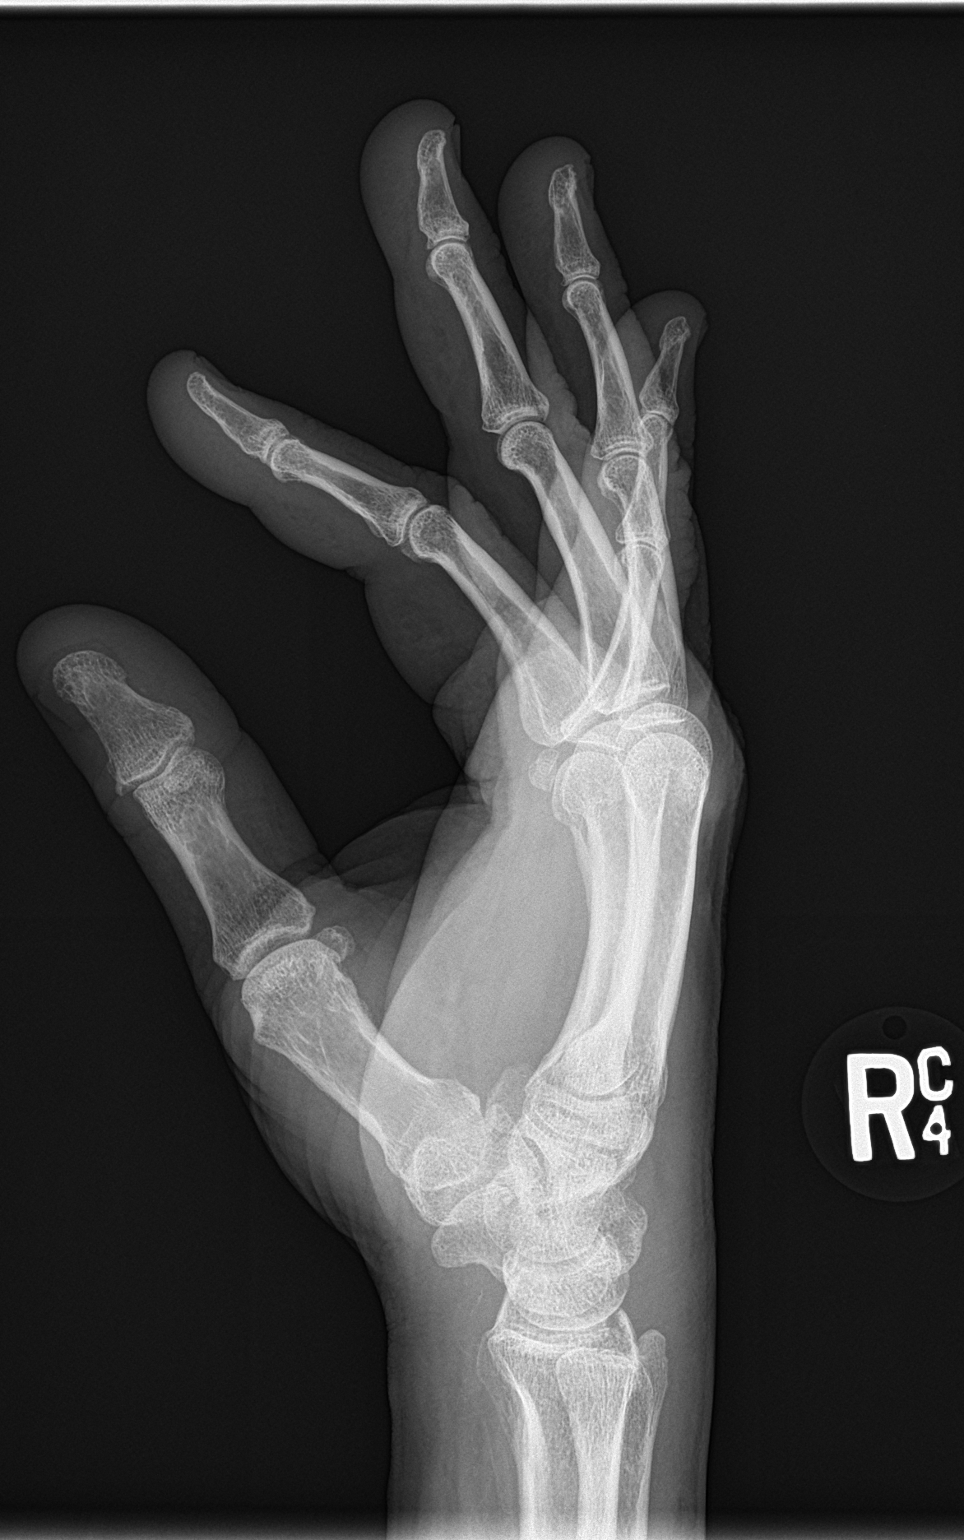

[3 of 3 positions shown; findings below may reference images not displayed]

FINDINGS: There is diffuse soft tissue swelling about the wrist. There is
lucency near the dorsal lip of the distal radius on the lateral
radiograph, indeterminate for a nondisplaced fracture. No fracture
is identified elsewhere. There is no dislocation. Atherosclerotic
vascular calcifications are noted.
IMPRESSION: Soft tissue swelling with possible nondisplaced fracture of the
distal radius.

## 2021-03-09 IMAGING — CR DG WRIST COMPLETE 3+V*R*
1 series · 4 of 4 positions shown · non-contrast
Comparison: None.

CLINICAL DATA: Fell on outstretched hand yesterday. Right wrist
pain and swelling with bruising along the ulnar side.

EXAM:
RIGHT WRIST - COMPLETE 3+ VIEW

[Series 1: dg wrist complete right · 0.14mm/px · 4 of 4 slices shown]
[im 1/4]
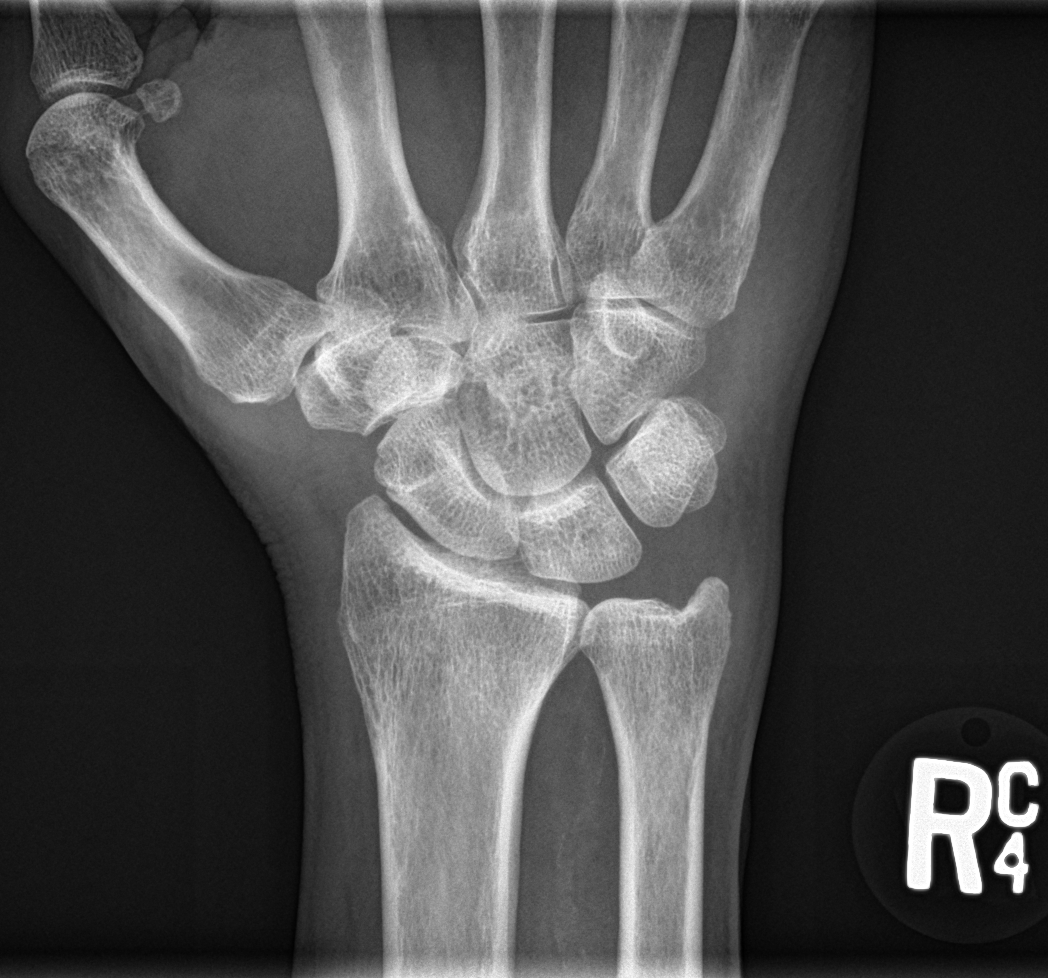
[im 2/4]
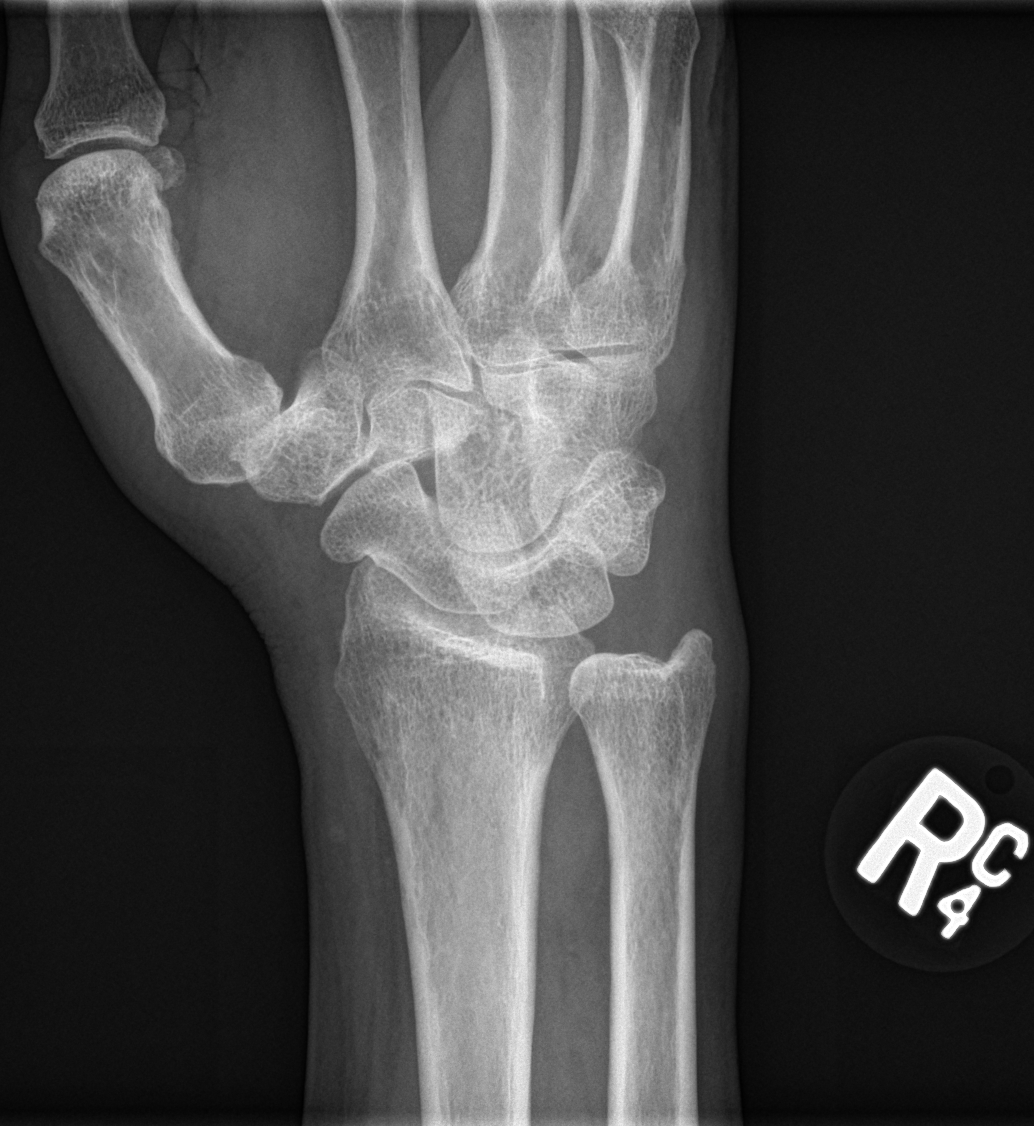
[im 3/4]
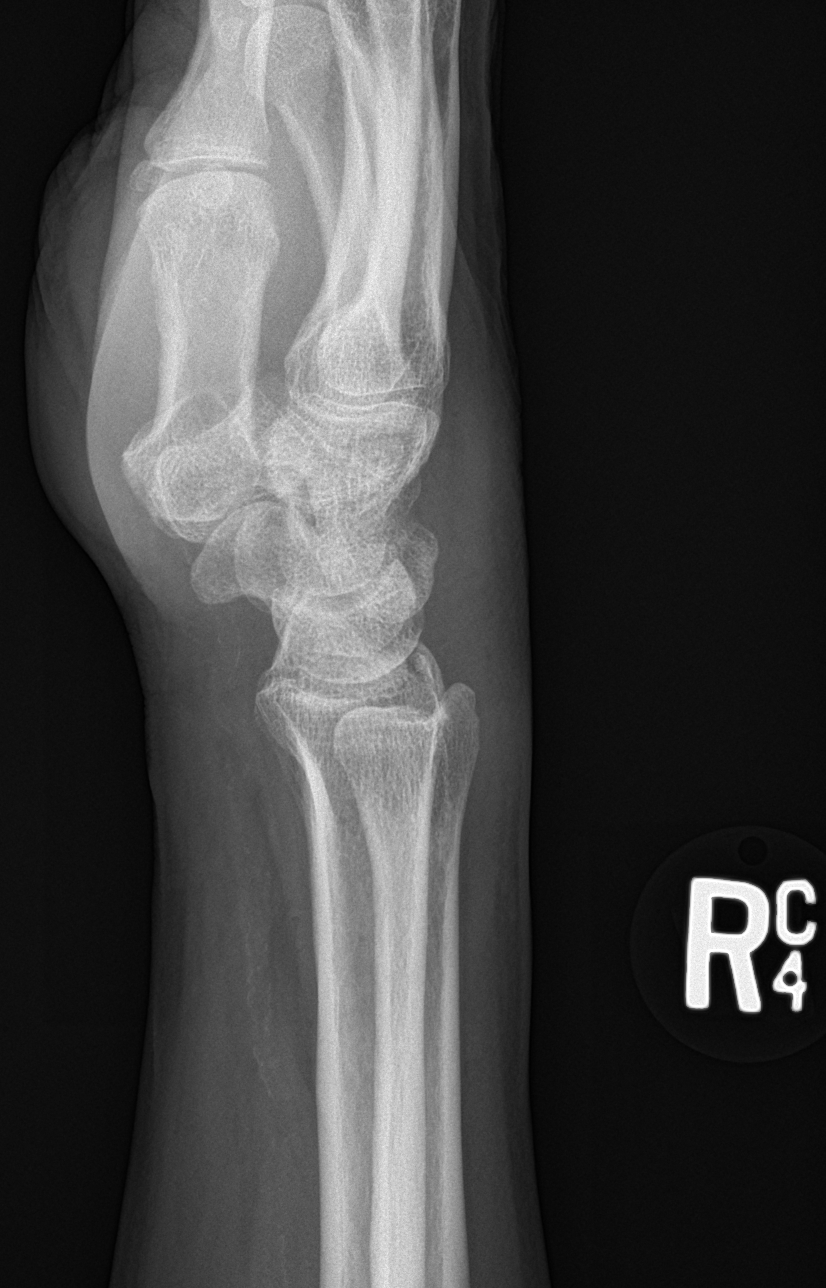
[im 4/4]
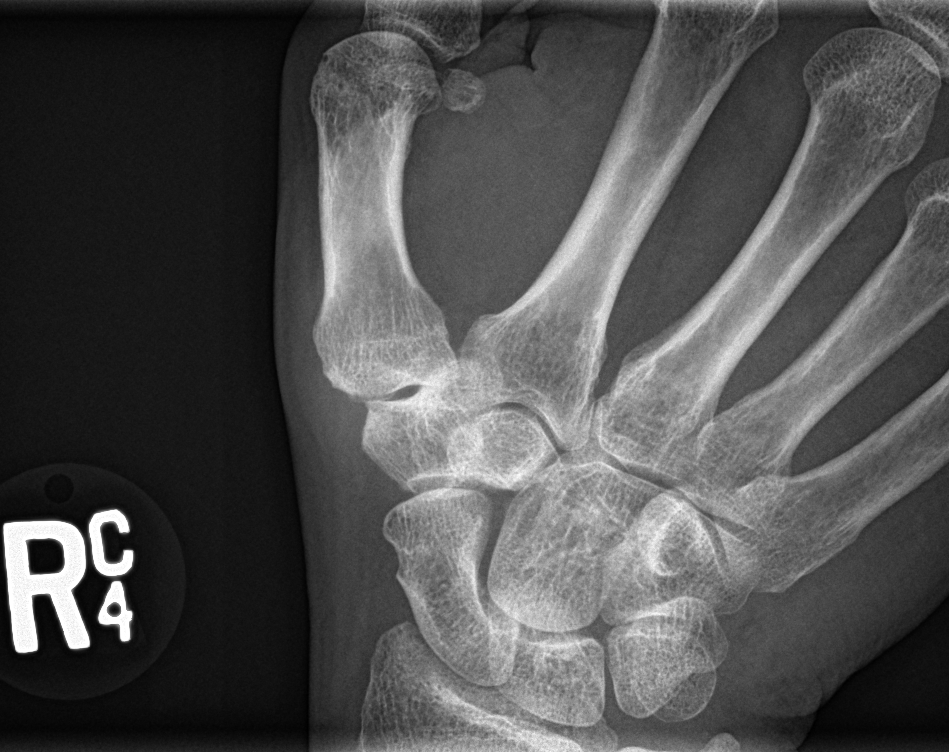

[4 of 4 positions shown; findings below may reference images not displayed]

FINDINGS: There is diffuse soft tissue swelling about the wrist. There is
lucency near the dorsal lip of the distal radius on the lateral
radiograph, indeterminate for a nondisplaced fracture. No fracture
is identified elsewhere. There is no dislocation. Atherosclerotic
vascular calcifications are noted.
IMPRESSION: Soft tissue swelling with possible nondisplaced fracture of the
distal radius.

## 2021-03-15 DIAGNOSIS — R935 Abnormal findings on diagnostic imaging of other abdominal regions, including retroperitoneum: Secondary | ICD-10-CM | POA: Diagnosis not present

## 2021-03-15 DIAGNOSIS — K760 Fatty (change of) liver, not elsewhere classified: Secondary | ICD-10-CM | POA: Diagnosis not present

## 2021-03-15 DIAGNOSIS — R932 Abnormal findings on diagnostic imaging of liver and biliary tract: Secondary | ICD-10-CM | POA: Diagnosis not present

## 2021-03-24 DIAGNOSIS — R319 Hematuria, unspecified: Secondary | ICD-10-CM | POA: Diagnosis not present

## 2021-03-24 DIAGNOSIS — R310 Gross hematuria: Secondary | ICD-10-CM | POA: Diagnosis not present

## 2021-03-24 DIAGNOSIS — R972 Elevated prostate specific antigen [PSA]: Secondary | ICD-10-CM | POA: Diagnosis not present

## 2021-03-29 DIAGNOSIS — H2513 Age-related nuclear cataract, bilateral: Secondary | ICD-10-CM | POA: Diagnosis not present

## 2021-04-07 DIAGNOSIS — K589 Irritable bowel syndrome without diarrhea: Secondary | ICD-10-CM | POA: Diagnosis not present

## 2021-04-14 DIAGNOSIS — L820 Inflamed seborrheic keratosis: Secondary | ICD-10-CM | POA: Diagnosis not present

## 2021-04-14 DIAGNOSIS — L918 Other hypertrophic disorders of the skin: Secondary | ICD-10-CM | POA: Diagnosis not present

## 2021-04-14 DIAGNOSIS — L538 Other specified erythematous conditions: Secondary | ICD-10-CM | POA: Diagnosis not present

## 2021-04-14 DIAGNOSIS — R208 Other disturbances of skin sensation: Secondary | ICD-10-CM | POA: Diagnosis not present

## 2021-04-24 DIAGNOSIS — Z23 Encounter for immunization: Secondary | ICD-10-CM | POA: Diagnosis not present

## 2021-04-25 ENCOUNTER — Ambulatory Visit: Payer: Self-pay | Admitting: Family Medicine

## 2021-05-16 DIAGNOSIS — Z23 Encounter for immunization: Secondary | ICD-10-CM | POA: Diagnosis not present

## 2021-06-09 ENCOUNTER — Other Ambulatory Visit: Payer: Self-pay | Admitting: Family Medicine

## 2021-06-11 NOTE — Telephone Encounter (Signed)
last RF 05/06/20 #90 4 RF

## 2021-06-14 ENCOUNTER — Other Ambulatory Visit: Payer: Self-pay | Admitting: Family Medicine

## 2021-06-20 ENCOUNTER — Ambulatory Visit: Payer: Medicare Other | Admitting: Family Medicine

## 2021-06-20 NOTE — Progress Notes (Deleted)
Annual Wellness Visit     Patient: Lawrence Ewing, Male    DOB: [birth year], 75 y.o.   MRN: [id number] Visit Date: 06/20/2021  Today's Provider: Lelon Huh, MD   No chief complaint on file.  Subjective    Lawrence Ewing is a 75 y.o. male who presents today for his Annual Wellness Visit. He reports consuming a {diet types:17450} diet. {Exercise:19826} He generally feels {well/fairly well/poorly:18703}. He reports sleeping {well/fairly well/poorly:18703}. He {does/does not:200015} have additional problems to discuss today.   HPI    Medications: Outpatient Medications Prior to Visit  Medication Sig   amLODipine (NORVASC) 2.5 MG tablet TAKE ONE TABLET BY MOUTH EVERY DAY   Cholecalciferol (VITAMIN D) 2000 UNITS CAPS Take 1 capsule by mouth daily.   citalopram (CELEXA) 10 MG tablet TAKE 1 TABLET BY MOUTH DAILY   citalopram (CELEXA) 20 MG tablet TAKE 1 TABLET BY MOUTH DAILY   MULTIPLE VITAMINS-MINERALS PO Take 1 tablet by mouth daily.   omeprazole (PRILOSEC) 20 MG capsule Take 1 capsule by mouth daily.   Probiotic Product (PROBIOTIC PO) Take by mouth daily.   tamsulosin (FLOMAX) 0.4 MG CAPS capsule TAKE 1 CAPSULE AT BEDTIME   vitamin E 400 UNIT capsule Take 400 Units by mouth daily.   No facility-administered medications prior to visit.    Allergies  Allergen Reactions   Sumatriptan Succinate Other (See Comments)    Other Reaction: FELT COLD &PULSE SLOWED Other Reaction: FELT COLD &PULSE SLOWED Other reaction(s): Other (See Comments) Other Reaction: FELT COLD &PULSE SLOWED   Temazepam Other (See Comments)    Other Reaction: PROFOUND DROWSINESS   Zolpidem Other (See Comments)    Other Reaction: PROLONGED SEDATION Other Reaction: PROLONGED SEDATION Other reaction(s): Other (See Comments) Other Reaction: PROLONGED SEDATION    Patient Care Team: Birdie Sons, MD as PCP - General (Family Medicine) Thomes Lolling, MD as Referring Physician  (Urology) Jerilynn Som, MD as Consulting Physician (Psychiatry) Dorna Bloom, MD as Referring Physician (Gastroenterology) Jairo Ben, [place] (Neurology) Eulogio Bear, MD as Consulting Physician (Ophthalmology) Larence Penning, Thornell Mule, MD as Referring Physician (Neurology)  Review of Systems  {Labs  Heme  Chem  Endocrine  Serology  Results Review (optional):23779}    Objective    Vitals: There were no vitals taken for this visit. {Show previous vital signs (optional):23777}  Physical Exam ***  Most recent functional status assessment: In your present state of health, do you have any difficulty performing the following activities: 08/22/2020  Hearing? N  Vision? N  Difficulty concentrating or making decisions? N  Walking or climbing stairs? N  Dressing or bathing? N  Doing errands, shopping? N  Some recent data might be hidden   Most recent fall risk assessment: Fall Risk  08/22/2020  Falls in the past year? 0  Number falls in past yr: 0  Injury with Fall? 0  Follow up -    Most recent depression screenings: PHQ 2/9 Scores 01/23/2021 08/22/2020  PHQ - 2 Score 0 0  PHQ- 9 Score 2 0   Most recent cognitive screening: No flowsheet data found. Most recent Audit-C alcohol use screening Alcohol Use Disorder Test (AUDIT) 08/22/2020  1. How often do you have a drink containing alcohol? 1  2. How many drinks containing alcohol do you have on a typical day when you are drinking? 0  3. How often do you have six or more drinks on one occasion? 0  AUDIT-C Score 1  Alcohol Brief Interventions/Follow-up AUDIT Score <7 follow-up not indicated   A score of 3 or more in women, and 4 or more in men indicates increased risk for alcohol abuse, EXCEPT if all of the points are from question 1   No results found for any visits on 06/20/21.  Assessment & Plan     Annual wellness visit done today including the all of the following: Reviewed patient's Family Medical  History Reviewed and updated list of patient's medical providers Assessment of cognitive impairment was done Assessed patient's functional ability Established a written schedule for health screening [place] Completed and Reviewed  Exercise Activities and Dietary recommendations  Goals      DIET - REDUCE SUGAR INTAKE     Recommend to cut back on sweets and desserts in daily diet to none.         Immunization History  Administered Date(s) Administered   Influenza, High Dose Seasonal PF 05/26/2015, 03/27/2016, 03/25/2018, 04/28/2019, 04/26/2020   Influenza-Unspecified 05/02/2017   Moderna Sars-Covid-2 Vaccination 08/26/2019, 09/23/2019   Pneumococcal Conjugate-13 03/25/2015   Pneumococcal Polysaccharide-23 10/05/2011   Tdap 02/24/2007   Zoster Recombinat (Shingrix) 01/30/2018, 06/21/2018    Health Maintenance  Topic Date Due   TETANUS/TDAP  02/23/2017   COVID-19 Vaccine (3 - Booster for Moderna series) 11/18/2019   INFLUENZA VACCINE  02/13/2021   COLONOSCOPY (Pts 45-50yrs Insurance coverage will need to be confirmed)  11/24/2025   Pneumonia Vaccine 84+ Years old  Completed   Hepatitis C Screening  Completed   Zoster Vaccines- Shingrix  Completed   HPV VACCINES  Aged Out     Discussed health benefits of physical activity, and encouraged him to engage in regular exercise appropriate for his age and condition.    ***  No follow-ups on file.     {provider attestation***:1}   Lelon Huh, MD  [facility] [phone] (phone) [phone] (fax)  [place]

## 2021-07-07 DIAGNOSIS — R197 Diarrhea, unspecified: Secondary | ICD-10-CM | POA: Diagnosis not present

## 2021-07-14 DIAGNOSIS — R972 Elevated prostate specific antigen [PSA]: Secondary | ICD-10-CM | POA: Diagnosis not present

## 2021-07-14 DIAGNOSIS — Z20822 Contact with and (suspected) exposure to covid-19: Secondary | ICD-10-CM | POA: Diagnosis not present

## 2021-09-08 DIAGNOSIS — R972 Elevated prostate specific antigen [PSA]: Secondary | ICD-10-CM | POA: Diagnosis not present

## 2021-09-08 DIAGNOSIS — H9193 Unspecified hearing loss, bilateral: Secondary | ICD-10-CM | POA: Diagnosis not present

## 2021-09-08 DIAGNOSIS — R350 Frequency of micturition: Secondary | ICD-10-CM | POA: Diagnosis not present

## 2021-09-08 DIAGNOSIS — L03019 Cellulitis of unspecified finger: Secondary | ICD-10-CM | POA: Diagnosis not present

## 2021-09-08 DIAGNOSIS — F419 Anxiety disorder, unspecified: Secondary | ICD-10-CM | POA: Diagnosis not present

## 2021-09-08 DIAGNOSIS — N401 Enlarged prostate with lower urinary tract symptoms: Secondary | ICD-10-CM | POA: Diagnosis not present

## 2021-09-08 DIAGNOSIS — K582 Mixed irritable bowel syndrome: Secondary | ICD-10-CM | POA: Diagnosis not present

## 2021-09-08 DIAGNOSIS — Z1159 Encounter for screening for other viral diseases: Secondary | ICD-10-CM | POA: Diagnosis not present

## 2021-09-12 DIAGNOSIS — Z20822 Contact with and (suspected) exposure to covid-19: Secondary | ICD-10-CM | POA: Diagnosis not present

## 2021-09-22 DIAGNOSIS — H9071 Mixed conductive and sensorineural hearing loss, unilateral, right ear, with unrestricted hearing on the contralateral side: Secondary | ICD-10-CM | POA: Diagnosis not present

## 2021-09-22 DIAGNOSIS — H9042 Sensorineural hearing loss, unilateral, left ear, with unrestricted hearing on the contralateral side: Secondary | ICD-10-CM | POA: Diagnosis not present

## 2021-09-29 DIAGNOSIS — Z1159 Encounter for screening for other viral diseases: Secondary | ICD-10-CM | POA: Diagnosis not present

## 2021-09-29 DIAGNOSIS — Z136 Encounter for screening for cardiovascular disorders: Secondary | ICD-10-CM | POA: Diagnosis not present

## 2021-10-06 DIAGNOSIS — K589 Irritable bowel syndrome without diarrhea: Secondary | ICD-10-CM | POA: Diagnosis not present

## 2021-10-25 DIAGNOSIS — L309 Dermatitis, unspecified: Secondary | ICD-10-CM | POA: Diagnosis not present

## 2021-10-25 DIAGNOSIS — D2261 Melanocytic nevi of right upper limb, including shoulder: Secondary | ICD-10-CM | POA: Diagnosis not present

## 2021-10-25 DIAGNOSIS — B353 Tinea pedis: Secondary | ICD-10-CM | POA: Diagnosis not present

## 2021-10-25 DIAGNOSIS — D2272 Melanocytic nevi of left lower limb, including hip: Secondary | ICD-10-CM | POA: Diagnosis not present

## 2021-11-08 DIAGNOSIS — R972 Elevated prostate specific antigen [PSA]: Secondary | ICD-10-CM | POA: Diagnosis not present

## 2021-12-14 ENCOUNTER — Telehealth: Payer: Self-pay | Admitting: Family Medicine

## 2021-12-14 NOTE — Telephone Encounter (Signed)
Copied from [place] [phone]. Topic: Medicare AWV >> Dec 14, 2021  2:34 PM Cher Nakai R wrote: Reason for CRM:  Left message for patient to call back and schedule Medicare Annual Wellness Visit (AWV) in office.   If unable to come into the office for AWV,  please offer to do virtually or by telephone.  Last AWV: 03/15/2020  Please schedule at anytime with [facility] Health Advisor.  30 minute appointment for Virtual or phone 45 minute appointment for in office or Initial virtual/phone  Any questions, please contact me at [phone]

## 2022-01-18 ENCOUNTER — Telehealth: Payer: Self-pay | Admitting: Family Medicine

## 2022-01-18 NOTE — Telephone Encounter (Signed)
Copied from [place] [phone]. Topic: Medicare AWV >> Jan 18, 2022 10:33 AM Jae Dire wrote: Reason for CRM: Left message for patient to call back and schedule Medicare Annual Wellness Visit (AWV) in office.   If unable to come into the office for AWV,  please offer to do virtually or by telephone.  Last AWV:  03/15/2020  Please schedule at anytime with [facility] Health Advisor.  30 minute appointment for Virtual or phone 45 minute appointment for in office or Initial virtual/phone  Any questions, please contact me at [phone]

## 2022-03-09 DIAGNOSIS — I10 Essential (primary) hypertension: Secondary | ICD-10-CM | POA: Diagnosis not present

## 2022-03-09 DIAGNOSIS — K429 Umbilical hernia without obstruction or gangrene: Secondary | ICD-10-CM | POA: Diagnosis not present

## 2022-03-09 DIAGNOSIS — E669 Obesity, unspecified: Secondary | ICD-10-CM | POA: Diagnosis not present

## 2022-03-14 DIAGNOSIS — K429 Umbilical hernia without obstruction or gangrene: Secondary | ICD-10-CM | POA: Diagnosis not present

## 2022-04-05 DIAGNOSIS — H2513 Age-related nuclear cataract, bilateral: Secondary | ICD-10-CM | POA: Diagnosis not present

## 2022-04-17 DIAGNOSIS — Z23 Encounter for immunization: Secondary | ICD-10-CM | POA: Diagnosis not present

## 2022-07-04 DIAGNOSIS — B349 Viral infection, unspecified: Secondary | ICD-10-CM | POA: Diagnosis not present

## 2022-07-04 DIAGNOSIS — J22 Unspecified acute lower respiratory infection: Secondary | ICD-10-CM | POA: Diagnosis not present

## 2022-07-06 ENCOUNTER — Telehealth: Payer: Self-pay | Admitting: Family Medicine

## 2022-07-06 DIAGNOSIS — H9190 Unspecified hearing loss, unspecified ear: Secondary | ICD-10-CM | POA: Diagnosis not present

## 2022-07-06 DIAGNOSIS — I10 Essential (primary) hypertension: Secondary | ICD-10-CM | POA: Diagnosis not present

## 2022-07-06 DIAGNOSIS — I81 Portal vein thrombosis: Secondary | ICD-10-CM | POA: Diagnosis not present

## 2022-07-06 DIAGNOSIS — Z6828 Body mass index (BMI) 28.0-28.9, adult: Secondary | ICD-10-CM | POA: Diagnosis not present

## 2022-07-06 DIAGNOSIS — Z20822 Contact with and (suspected) exposure to covid-19: Secondary | ICD-10-CM | POA: Diagnosis not present

## 2022-07-06 DIAGNOSIS — R7401 Elevation of levels of liver transaminase levels: Secondary | ICD-10-CM | POA: Diagnosis not present

## 2022-07-06 DIAGNOSIS — N401 Enlarged prostate with lower urinary tract symptoms: Secondary | ICD-10-CM | POA: Diagnosis not present

## 2022-07-06 DIAGNOSIS — Z87891 Personal history of nicotine dependence: Secondary | ICD-10-CM | POA: Diagnosis not present

## 2022-07-06 DIAGNOSIS — D1803 Hemangioma of intra-abdominal structures: Secondary | ICD-10-CM | POA: Diagnosis not present

## 2022-07-06 DIAGNOSIS — R9431 Abnormal electrocardiogram [ECG] [EKG]: Secondary | ICD-10-CM | POA: Diagnosis not present

## 2022-07-06 DIAGNOSIS — N400 Enlarged prostate without lower urinary tract symptoms: Secondary | ICD-10-CM | POA: Diagnosis not present

## 2022-07-06 DIAGNOSIS — K8080 Other cholelithiasis without obstruction: Secondary | ICD-10-CM | POA: Diagnosis not present

## 2022-07-06 DIAGNOSIS — K420 Umbilical hernia with obstruction, without gangrene: Secondary | ICD-10-CM | POA: Diagnosis not present

## 2022-07-06 DIAGNOSIS — K8070 Calculus of gallbladder and bile duct without cholecystitis without obstruction: Secondary | ICD-10-CM | POA: Diagnosis not present

## 2022-07-06 DIAGNOSIS — K8020 Calculus of gallbladder without cholecystitis without obstruction: Secondary | ICD-10-CM | POA: Diagnosis not present

## 2022-07-06 DIAGNOSIS — R1011 Right upper quadrant pain: Secondary | ICD-10-CM | POA: Diagnosis not present

## 2022-07-06 DIAGNOSIS — D3502 Benign neoplasm of left adrenal gland: Secondary | ICD-10-CM | POA: Diagnosis not present

## 2022-07-06 DIAGNOSIS — K760 Fatty (change of) liver, not elsewhere classified: Secondary | ICD-10-CM | POA: Diagnosis not present

## 2022-07-06 DIAGNOSIS — R160 Hepatomegaly, not elsewhere classified: Secondary | ICD-10-CM | POA: Diagnosis not present

## 2022-07-06 DIAGNOSIS — Z79899 Other long term (current) drug therapy: Secondary | ICD-10-CM | POA: Diagnosis not present

## 2022-07-06 DIAGNOSIS — E669 Obesity, unspecified: Secondary | ICD-10-CM | POA: Diagnosis not present

## 2022-07-06 DIAGNOSIS — R3915 Urgency of urination: Secondary | ICD-10-CM | POA: Diagnosis not present

## 2022-07-06 DIAGNOSIS — D72829 Elevated white blood cell count, unspecified: Secondary | ICD-10-CM | POA: Diagnosis not present

## 2022-07-06 DIAGNOSIS — K8000 Calculus of gallbladder with acute cholecystitis without obstruction: Secondary | ICD-10-CM | POA: Diagnosis not present

## 2022-07-06 DIAGNOSIS — R509 Fever, unspecified: Secondary | ICD-10-CM | POA: Diagnosis not present

## 2022-07-06 NOTE — Telephone Encounter (Signed)
Left message for patient to call back and schedule Medicare Annual Wellness Visit (AWV) in office.   If not able to come in office, please offer to do virtually or by telephone.  Left office number and my jabber #[phone].  Last AWV:03/15/2020  Please schedule at anytime with Nurse Health Advisor.   

## 2022-07-07 DIAGNOSIS — K420 Umbilical hernia with obstruction, without gangrene: Secondary | ICD-10-CM | POA: Diagnosis not present

## 2022-07-07 DIAGNOSIS — E669 Obesity, unspecified: Secondary | ICD-10-CM | POA: Diagnosis not present

## 2022-07-07 DIAGNOSIS — R972 Elevated prostate specific antigen [PSA]: Secondary | ICD-10-CM | POA: Diagnosis not present

## 2022-07-07 DIAGNOSIS — F32A Depression, unspecified: Secondary | ICD-10-CM | POA: Diagnosis not present

## 2022-07-07 DIAGNOSIS — N401 Enlarged prostate with lower urinary tract symptoms: Secondary | ICD-10-CM | POA: Diagnosis not present

## 2022-07-07 DIAGNOSIS — K8020 Calculus of gallbladder without cholecystitis without obstruction: Secondary | ICD-10-CM | POA: Diagnosis not present

## 2022-07-07 DIAGNOSIS — R7401 Elevation of levels of liver transaminase levels: Secondary | ICD-10-CM | POA: Diagnosis not present

## 2022-07-07 DIAGNOSIS — F419 Anxiety disorder, unspecified: Secondary | ICD-10-CM | POA: Diagnosis not present

## 2022-07-07 DIAGNOSIS — K8010 Calculus of gallbladder with chronic cholecystitis without obstruction: Secondary | ICD-10-CM | POA: Diagnosis not present

## 2022-07-07 DIAGNOSIS — K810 Acute cholecystitis: Secondary | ICD-10-CM | POA: Diagnosis not present

## 2022-07-07 DIAGNOSIS — I10 Essential (primary) hypertension: Secondary | ICD-10-CM | POA: Diagnosis not present

## 2022-07-07 DIAGNOSIS — Z79899 Other long term (current) drug therapy: Secondary | ICD-10-CM | POA: Diagnosis not present

## 2022-07-07 DIAGNOSIS — I81 Portal vein thrombosis: Secondary | ICD-10-CM | POA: Diagnosis not present

## 2022-07-24 ENCOUNTER — Telehealth: Payer: Self-pay | Admitting: *Deleted

## 2022-07-24 NOTE — Patient Outreach (Signed)
  Care Coordination [facility] Note Transition Care Management Unsuccessful Follow-up Telephone Call  Date of discharge and from where:  [phone] [facility] choledocholithiasis  Attempts:  1st Attempt  Reason for unsuccessful TCM follow-up call:  Left voice message  [place] Management [phone]

## 2022-07-25 ENCOUNTER — Telehealth: Payer: Self-pay | Admitting: *Deleted

## 2022-07-25 NOTE — Patient Outreach (Signed)
  Care Coordination [facility] Note Transition Care Management Unsuccessful Follow-up Telephone Call  Date of discharge and from where:  [facility] [phone] Choledocholithiasis  Attempts:  2nd Attempt  Reason for unsuccessful TCM follow-up call:  Left voice message   [place] Care Management [phone]

## 2022-07-26 ENCOUNTER — Telehealth: Payer: Self-pay | Admitting: *Deleted

## 2022-07-26 NOTE — Patient Outreach (Signed)
  Care Coordination [facility] Note Transition Care Management Unsuccessful Follow-up Telephone Call  Date of discharge and from where:  [facility] [phone]  Attempts:  3rd Attempt  Reason for unsuccessful TCM follow-up call:  Left voice message   [place] Management [phone]
